# Patient Record
Sex: Female | Born: 1995 | Race: Asian | Hispanic: No | Marital: Single | Smoking: Never smoker
Health system: Southern US, Community
[De-identification: ages and names within clinical notes are randomized; demographics above are authoritative.]

## PROBLEM LIST (undated history)

## (undated) DIAGNOSIS — Z789 Other specified health status: Secondary | ICD-10-CM

## (undated) DIAGNOSIS — N83209 Unspecified ovarian cyst, unspecified side: Secondary | ICD-10-CM

## (undated) HISTORY — PX: WISDOM TOOTH EXTRACTION: SHX21

---
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
## 2016-01-28 ENCOUNTER — Emergency Department (HOSPITAL_COMMUNITY): Payer: BLUE CROSS/BLUE SHIELD

## 2016-01-28 ENCOUNTER — Emergency Department (HOSPITAL_COMMUNITY)
Admission: EM | Admit: 2016-01-28 | Discharge: 2016-01-28 | Disposition: A | Discharge status: Home / Self Care | Payer: BLUE CROSS/BLUE SHIELD | Attending: Emergency Medicine | Admitting: Emergency Medicine

## 2016-01-28 ENCOUNTER — Encounter (HOSPITAL_COMMUNITY): Payer: Self-pay | Admitting: Emergency Medicine

## 2016-01-28 DIAGNOSIS — R102 Pelvic and perineal pain: Secondary | ICD-10-CM | POA: Diagnosis not present

## 2016-01-28 DIAGNOSIS — N83201 Unspecified ovarian cyst, right side: Secondary | ICD-10-CM | POA: Diagnosis not present

## 2016-01-28 DIAGNOSIS — R1031 Right lower quadrant pain: Secondary | ICD-10-CM | POA: Diagnosis present

## 2016-01-28 LAB — LIPASE, BLOOD: Lipase: 32 U/L (ref 11–51)

## 2016-01-28 LAB — COMPREHENSIVE METABOLIC PANEL
ALK PHOS: 44 U/L (ref 38–126)
ALT: 20 U/L (ref 14–54)
AST: 20 U/L (ref 15–41)
Albumin: 4 g/dL (ref 3.5–5.0)
Anion gap: 10 (ref 5–15)
BUN: 11 mg/dL (ref 6–20)
CALCIUM: 9.4 mg/dL (ref 8.9–10.3)
CO2: 23 mmol/L (ref 22–32)
CREATININE: 0.75 mg/dL (ref 0.44–1.00)
Chloride: 104 mmol/L (ref 101–111)
Glucose, Bld: 139 mg/dL — ABNORMAL HIGH (ref 65–99)
Potassium: 4.3 mmol/L (ref 3.5–5.1)
SODIUM: 137 mmol/L (ref 135–145)
Total Bilirubin: 0.8 mg/dL (ref 0.3–1.2)
Total Protein: 7.2 g/dL (ref 6.5–8.1)

## 2016-01-28 LAB — URINALYSIS, ROUTINE W REFLEX MICROSCOPIC
BILIRUBIN URINE: NEGATIVE
Glucose, UA: NEGATIVE mg/dL
HGB URINE DIPSTICK: NEGATIVE
Leukocytes, UA: NEGATIVE
Nitrite: NEGATIVE
PROTEIN: 30 mg/dL — AB
Specific Gravity, Urine: 1.04 — ABNORMAL HIGH (ref 1.005–1.030)
pH: 7 (ref 5.0–8.0)

## 2016-01-28 LAB — CBC
HCT: 43.2 % (ref 36.0–46.0)
Hemoglobin: 14 g/dL (ref 12.0–15.0)
MCH: 27.9 pg (ref 26.0–34.0)
MCHC: 32.4 g/dL (ref 30.0–36.0)
MCV: 86.2 fL (ref 78.0–100.0)
PLATELETS: 234 10*3/uL (ref 150–400)
RBC: 5.01 MIL/uL (ref 3.87–5.11)
RDW: 12.7 % (ref 11.5–15.5)
WBC: 15.2 10*3/uL — ABNORMAL HIGH (ref 4.0–10.5)

## 2016-01-28 LAB — URINE MICROSCOPIC-ADD ON

## 2016-01-28 LAB — WET PREP, GENITAL
CLUE CELLS WET PREP: NONE SEEN
Sperm: NONE SEEN
TRICH WET PREP: NONE SEEN
Yeast Wet Prep HPF POC: NONE SEEN

## 2016-01-28 LAB — PREGNANCY, URINE: PREG TEST UR: NEGATIVE

## 2016-01-28 MED ORDER — ONDANSETRON HCL 4 MG/2ML IJ SOLN
4.0000 mg | Freq: Once | INTRAMUSCULAR | Status: AC
  Administered 2016-01-28: 4 mg via INTRAVENOUS
  Filled 2016-01-28: qty 2

## 2016-01-28 MED ORDER — MORPHINE SULFATE (PF) 4 MG/ML IV SOLN
4.0000 mg | Freq: Once | INTRAVENOUS | Status: AC
  Administered 2016-01-28: 4 mg via INTRAVENOUS
  Filled 2016-01-28: qty 1

## 2016-01-28 MED ORDER — METOCLOPRAMIDE HCL 10 MG PO TABS: 10.0000 mg | ORAL_TABLET | Freq: Four times a day (QID) | ORAL | 0 refills | Status: DC | PRN

## 2016-01-28 MED ORDER — IOPAMIDOL (ISOVUE-300) INJECTION 61%
INTRAVENOUS | Status: AC
  Administered 2016-01-28: 100 mL
  Filled 2016-01-28: qty 100

## 2016-01-28 MED ORDER — HYDROCODONE-ACETAMINOPHEN 5-325 MG PO TABS: 1.0000 | ORAL_TABLET | Freq: Four times a day (QID) | ORAL | 0 refills | Status: DC | PRN

## 2016-01-28 MED ORDER — IBUPROFEN 600 MG PO TABS: 600.0000 mg | ORAL_TABLET | Freq: Four times a day (QID) | ORAL | 0 refills | Status: DC | PRN

## 2016-01-28 MED ORDER — SODIUM CHLORIDE 0.9 % IV BOLUS (SEPSIS)
1000.0000 mL | Freq: Once | INTRAVENOUS | Status: AC
  Administered 2016-01-28: 1000 mL via INTRAVENOUS

## 2016-01-28 NOTE — ED Notes (Signed)
Pt reports that her hands and feet have been feeling cold since onset of symptoms last night at 11pm.

## 2016-01-28 NOTE — ED Provider Notes (Signed)
[name] DEPT Provider Note   CSN: [id number] Arrival date & time: 01/28/16  [id number]     History   Chief Complaint Chief Complaint  Patient presents with  . Abdominal Pain    Lower Right  . Nausea  . Emesis    HPI Morgan Moran is a 20 y.o. female here presenting with right lower quadrant pain. Right lower quadrant pain that is constant and all starting from 11 PM last night. Associated with 5 episodes of vomiting. She states that she has subjective chills as well. She states that she has no urinary symptoms and is sexually active but denies being pregnant. Last menstrual period about a month ago. She denies any vaginal bleeding or discharge. Took advil prior to arrival.   The history is provided by the patient.    History reviewed. No pertinent past medical history.  There are no active problems to display for this patient.   History reviewed. No pertinent surgical history.  OB History    No data available       Home Medications    Prior to Admission medications   Medication Sig Start Date End Date Taking? Authorizing Provider  ibuprofen (ADVIL,MOTRIN) 200 MG tablet Take 400 mg by mouth every 6 (six) hours as needed for moderate pain.   Yes Historical Provider, MD    Family History No family history on file.  Social History Social History  Substance Use Topics  . Smoking status: Never Smoker  . Smokeless tobacco: Never Used  . Alcohol use Yes     Allergies   Review of patient's allergies indicates no known allergies.   Review of Systems Review of Systems  Gastrointestinal: Positive for abdominal pain and vomiting.  All other systems reviewed and are negative.    Physical Exam Updated Vital Signs BP 122/86 (BP Location: Left Arm)   Pulse 75   Temp 98 F (36.7 C) (Oral)   Resp 16   Ht 5\' 4"  (1.626 m)   Wt 200 lb (90.7 kg)   LMP 12/26/2015 (Approximate)   SpO2 99%   BMI 34.33 kg/m   Physical Exam  Constitutional: She is oriented to  person, place, and time.  Slightly uncomfortable   HENT:  Head: Normocephalic.  Mouth/Throat: Oropharynx is clear and moist.  Eyes: EOM are normal. Pupils are equal, round, and reactive to light.  Neck: Normal range of motion. Neck supple.  Cardiovascular: Normal rate, regular rhythm and normal heart sounds.   Pulmonary/Chest: Effort normal and breath sounds normal. No respiratory distress. She has no wheezes. She has no rales.  Abdominal: Soft. Bowel sounds are normal.  + RLQ tenderness, no rebound.   Musculoskeletal: Normal range of motion.  Neurological: She is alert and oriented to person, place, and time.  Skin: Skin is warm.  Psychiatric: She has a normal mood and affect.  Nursing note and vitals reviewed.    ED Treatments / Results  Labs (all labs ordered are listed, but only abnormal results are displayed) Labs Reviewed  WET PREP, GENITAL - Abnormal; Notable for the following:       Result Value   WBC, Wet Prep HPF POC PRESENT (*)    All other components within normal limits  COMPREHENSIVE METABOLIC PANEL - Abnormal; Notable for the following:    Glucose, Bld 139 (*)    All other components within normal limits  CBC - Abnormal; Notable for the following:    WBC 15.2 (*)    All other components within normal  limits  URINALYSIS, ROUTINE W REFLEX MICROSCOPIC (NOT AT [facility]) - Abnormal; Notable for the following:    Color, Urine AMBER (*)    APPearance CLOUDY (*)    Specific Gravity, Urine 1.040 (*)    Ketones, ur >80 (*)    Protein, ur 30 (*)    All other components within normal limits  URINE MICROSCOPIC-ADD ON - Abnormal; Notable for the following:    Squamous Epithelial / LPF 6-30 (*)    Bacteria, UA FEW (*)    All other components within normal limits  LIPASE, BLOOD  PREGNANCY, URINE  GC/CHLAMYDIA PROBE AMP ([place]) NOT AT [facility]    EKG  EKG Interpretation None       Radiology US Transvaginal Non-ob  Result Date: 01/28/2016 CLINICAL DATA:  Right  pelvic pain. EXAM: TRANSABDOMINAL AND TRANSVAGINAL ULTRASOUND OF PELVIS DOPPLER ULTRASOUND OF OVARIES TECHNIQUE: Both transabdominal and transvaginal ultrasound examinations of the pelvis were performed. Transabdominal technique was performed for global imaging of the pelvis including uterus, ovaries, adnexal regions, and pelvic cul-de-sac. It was necessary to proceed with endovaginal exam following the transabdominal exam to visualize the uterus and ovaries. Color and duplex Doppler ultrasound was utilized to evaluate blood flow to the ovaries. COMPARISON:  CT 01/28/2016. FINDINGS: Uterus Measurements: 8.5 x 3.2 x 5.0 cm. No fibroids or other mass visualized. Nabothian cyst. Endometrium Thickness: 12 mm.  No focal abnormality visualized. Right ovary Measurements: 9.0 x 5.2 x 7.0 cm. 6.9 x 4.9 x 8.1 cm simple appearing cyst. Left ovary Measurements: 3.5 x 2.1 x 2.3 cm. Normal appearance/no adnexal mass. Pulsed Doppler evaluation of both ovaries demonstrates normal low-resistance arterial and venous waveforms. Other findings Small amount of free pelvic fluid. IMPRESSION: 6.9 x 4.9 x 8.1 cm simple right ovarian cyst. No evidence of torsion. Small amount of free pelvic fluid. Electronically Signed   By: Marcello Moores  Register   On: 01/28/2016 11:32   Ct Abdomen Pelvis W Contrast  Result Date: 01/28/2016 CLINICAL DATA:  Right lower quadrant pain since last night. Nausea and vomiting. Concern for appendicitis. EXAM: CT ABDOMEN AND PELVIS WITH CONTRAST TECHNIQUE: Multidetector CT imaging of the abdomen and pelvis was performed using the standard protocol following bolus administration of intravenous contrast. CONTRAST:  11mL ISOVUE-300 IOPAMIDOL (ISOVUE-300) INJECTION 61% COMPARISON:  None. FINDINGS: Lower chest: 3 x 2 mm calcified nodule in the right lower lobe. Hepatobiliary: No focal liver abnormality is seen. No gallstones, gallbladder wall thickening, or biliary dilatation. Pancreas: Unremarkable. Spleen:  Unremarkable. Adrenals/Urinary Tract: Adrenal glands are unremarkable. Kidneys are normal, without renal calculi, focal lesion, or hydronephrosis. Bladder is unremarkable. Stomach/Bowel: Stomach is within normal limits. Appendix appears normal. No evidence of bowel wall thickening or obstruction. Vascular/Lymphatic: No significant vascular findings are present. No enlarged abdominal or pelvic lymph nodes. Reproductive: Focal low-density at the level of the cervix may reflect fluid/ blood products in the endocervical canal. Left ovary is unremarkable. There is a 7.7 x 6.5 cm low-density cyst in the right adnexa. Small volume soft tissue along its margins may reflect normal ovarian tissue or a small amount of hemorrhage. There is mild surrounding stranding in the right adnexa/right lower quadrant. A right ovarian corpus luteum is suspected more posteriorly. Other: No abdominal wall mass or hernia. Musculoskeletal: No acute or significant osseous findings. IMPRESSION: 1. 7.7 cm right ovarian cyst with mild surrounding inflammatory change. If there is clinical concern for ovarian torsion, pelvic ultrasound (with Doppler imaging) is recommended for further evaluation. A follow-up ultrasound will also  be needed in 6-12 weeks to ensure resolution. 2. Normal appendix. Electronically Signed   By: Logan Bores M.D.   On: 01/28/2016 09:37   [place] Art/ven Flow Abd Pelv Doppler  Result Date: 01/28/2016 CLINICAL DATA:  Right pelvic pain. EXAM: TRANSABDOMINAL AND TRANSVAGINAL ULTRASOUND OF PELVIS DOPPLER ULTRASOUND OF OVARIES TECHNIQUE: Both transabdominal and transvaginal ultrasound examinations of the pelvis were performed. Transabdominal technique was performed for global imaging of the pelvis including uterus, ovaries, adnexal regions, and pelvic cul-de-sac. It was necessary to proceed with endovaginal exam following the transabdominal exam to visualize the uterus and ovaries. Color and duplex Doppler ultrasound was utilized  to evaluate blood flow to the ovaries. COMPARISON:  CT 01/28/2016. FINDINGS: Uterus Measurements: 8.5 x 3.2 x 5.0 cm. No fibroids or other mass visualized. Nabothian cyst. Endometrium Thickness: 12 mm.  No focal abnormality visualized. Right ovary Measurements: 9.0 x 5.2 x 7.0 cm. 6.9 x 4.9 x 8.1 cm simple appearing cyst. Left ovary Measurements: 3.5 x 2.1 x 2.3 cm. Normal appearance/no adnexal mass. Pulsed Doppler evaluation of both ovaries demonstrates normal low-resistance arterial and venous waveforms. Other findings Small amount of free pelvic fluid. IMPRESSION: 6.9 x 4.9 x 8.1 cm simple right ovarian cyst. No evidence of torsion. Small amount of free pelvic fluid. Electronically Signed   By: Marcello Moores  Register   On: 01/28/2016 11:32    Procedures Procedures (including critical care time)  Medications Ordered in ED Medications  sodium chloride 0.9 % bolus 1,000 mL (0 mLs Intravenous Stopped 01/28/16 0919)  ondansetron (ZOFRAN) injection 4 mg (4 mg Intravenous Given 01/28/16 0727)  iopamidol (ISOVUE-300) 61 % injection (100 mLs  Contrast Given 01/28/16 0834)  morphine 4 MG/ML injection 4 mg (4 mg Intravenous Given 01/28/16 0954)     Initial Impression / Assessment and Plan / ED Course  I have reviewed the triage vital signs and the nursing notes.  Pertinent labs & imaging results that were available during my care of the patient were reviewed by me and considered in my medical decision making (see chart for details).  Clinical Course    Morgan Moran is a 20 y.o. female here with RLQ pain and vomiting and chills. Likely appy. Will get CT ab/pel and labs and UA.   9:30 am CT showed nl appendix but there is 7 cm R ovarian cyst. Pelvic exam showed whitish discharge, not foul smelling. No CMT. I had hard time palpating the cyst on bimanual due to body habitus. Will get ovarian US to r/o torsion.   11:51 AM US showed 7 cm cyst with mild free fluid and no signs of torsion. Pain controlled.  Consulted Dr. Rip Harbour from [facility], who states that since patient's pain is under control, likely had ruptured cyst. WBC 15 but UA unremarkable. He felt that unlikely to have torsion/detorsion. Recommend pain meds, OB follow up and repeat US in several weeks. Updated patient.     Final Clinical Impressions(s) / ED Diagnoses   Final diagnoses:  Pelvic pain  Pelvic pain    New Prescriptions New Prescriptions   No medications on file     Drenda Freeze, MD 01/28/16 1152

## 2016-01-28 NOTE — Discharge Instructions (Signed)
Take motrin for pain.   Take vicodin for severe pain. Do NOT drive with it.   Take reglan for nausea.   Stay hydrated.   See GYN doctor at [facility]. You will need repeat ultrasound in several weeks  Return to ER if you have severe abdominal pain, vomiting, fevers.

## 2016-01-28 NOTE — ED Triage Notes (Signed)
Pt c/o Right lower quadrant abdominal pain with N/V onset at 11pm. Pt denies change in diet.

## 2016-01-31 LAB — GC/CHLAMYDIA PROBE AMP (~~LOC~~) NOT AT ARMC
CHLAMYDIA, DNA PROBE: NEGATIVE
NEISSERIA GONORRHEA: NEGATIVE

## 2017-01-31 ENCOUNTER — Ambulatory Visit (INDEPENDENT_AMBULATORY_CARE_PROVIDER_SITE_OTHER): Payer: Self-pay | Admitting: Nurse Practitioner

## 2017-01-31 VITALS — BP 105/70 | HR 70 | Temp 98.4°F | Resp 16 | Ht 64.5 in | Wt 188.8 lb

## 2017-01-31 DIAGNOSIS — Z0000 Encounter for general adult medical examination without abnormal findings: Secondary | ICD-10-CM

## 2017-01-31 NOTE — Patient Instructions (Addendum)
Preventive Care 18-39 Years, Female Preventive care refers to lifestyle choices and visits with your health care provider that can promote health and wellness. What does preventive care include?  A yearly physical exam. This is also called an annual well check.  Dental exams once or twice a year.  Routine eye exams. Ask your health care provider how often you should have your eyes checked.  Personal lifestyle choices, including: ? Daily care of your teeth and gums. ? Regular physical activity. ? Eating a healthy diet. ? Avoiding tobacco and drug use. ? Limiting alcohol use. ? Practicing safe sex. ? Taking vitamin and mineral supplements as recommended by your health care provider. What happens during an annual well check? The services and screenings done by your health care provider during your annual well check will depend on your age, overall health, lifestyle risk factors, and family history of disease. Counseling Your health care provider may ask you questions about your:  Alcohol use.  Tobacco use.  Drug use.  Emotional well-being.  Home and relationship well-being.  Sexual activity.  Eating habits.  Work and work Statistician.  Method of birth control.  Menstrual cycle.  Pregnancy history.  Screening You may have the following tests or measurements:  Height, weight, and BMI.  Diabetes screening. This is done by checking your blood sugar (glucose) after you have not eaten for a while (fasting).  Blood pressure.  Lipid and cholesterol levels. These may be checked every 5 years starting at age 66.  Skin check.  Hepatitis C blood test.  Hepatitis B blood test.  Sexually transmitted disease (STD) testing.  BRCA-related cancer screening. This may be done if you have a family history of breast, ovarian, tubal, or peritoneal cancers.  Pelvic exam and Pap test. This may be done every 3 years starting at age 40. Starting at age 59, this may be done every 5  years if you have a Pap test in combination with an HPV test.  Discuss your test results, treatment options, and if necessary, the need for more tests with your health care provider. Vaccines Your health care provider may recommend certain vaccines, such as:  Influenza vaccine. This is recommended every year.  Tetanus, diphtheria, and acellular pertussis (Tdap, Td) vaccine. You may need a Td booster every 10 years.  Varicella vaccine. You may need this if you have not been vaccinated.  HPV vaccine. If you are 69 or younger, you may need three doses over 6 months.  Measles, mumps, and rubella (MMR) vaccine. You may need at least one dose of MMR. You may also need a second dose.  Pneumococcal 13-valent conjugate (PCV13) vaccine. You may need this if you have certain conditions and were not previously vaccinated.  Pneumococcal polysaccharide (PPSV23) vaccine. You may need one or two doses if you smoke cigarettes or if you have certain conditions.  Meningococcal vaccine. One dose is recommended if you are age 27-21 years and a first-year college student living in a residence hall, or if you have one of several medical conditions. You may also need additional booster doses.  Hepatitis A vaccine. You may need this if you have certain conditions or if you travel or work in places where you may be exposed to hepatitis A.  Hepatitis B vaccine. You may need this if you have certain conditions or if you travel or work in places where you may be exposed to hepatitis B.  Haemophilus influenzae type b (Hib) vaccine. You may need this if  you have certain risk factors.  Talk to your health care provider about which screenings and vaccines you need and how often you need them. This information is not intended to replace advice given to you by your health care provider. Make sure you discuss any questions you have with your health care provider. Document Released: 06/06/2001 Document Revised: 12/29/2015  Document Reviewed: 02/09/2015 Elsevier Interactive Patient Education  2017 [place] Maintenance, Female Adopting a healthy lifestyle and getting preventive care can go a long way to promote health and wellness. Talk with your health care provider about what schedule of regular examinations is right for you. This is a good chance for you to check in with your provider about disease prevention and staying healthy. In between checkups, there are plenty of things you can do on your own. Experts have done a lot of research about which lifestyle changes and preventive measures are most likely to keep you healthy. Ask your health care provider for more information. Weight and diet Eat a healthy diet  Be sure to include plenty of vegetables, fruits, low-fat dairy products, and lean protein.  Do not eat a lot of foods high in solid fats, added sugars, or salt.  Get regular exercise. This is one of the most important things you can do for your health. ? Most adults should exercise for at least 150 minutes each week. The exercise should increase your heart rate and make you sweat (moderate-intensity exercise). ? Most adults should also do strengthening exercises at least twice a week. This is in addition to the moderate-intensity exercise.  Maintain a healthy weight  Body mass index (BMI) is a measurement that can be used to identify possible weight problems. It estimates body fat based on height and weight. Your health care provider can help determine your BMI and help you achieve or maintain a healthy weight.  For females 25 years of age and older: ? A BMI below 18.5 is considered underweight. ? A BMI of 18.5 to 24.9 is normal. ? A BMI of 25 to 29.9 is considered overweight. ? A BMI of 30 and above is considered obese.  Watch levels of cholesterol and blood lipids  You should start having your blood tested for lipids and cholesterol at 22 years of age, then have this test every 5  years.  You may need to have your cholesterol levels checked more often if: ? Your lipid or cholesterol levels are high. ? You are older than 22 years of age. ? You are at high risk for heart disease.  Cancer screening Lung Cancer  Lung cancer screening is recommended for adults 41-71 years old who are at high risk for lung cancer because of a history of smoking.  A yearly low-dose CT scan of the lungs is recommended for people who: ? Currently smoke. ? Have quit within the past 15 years. ? Have at least a 30-pack-year history of smoking. A pack year is smoking an average of one pack of cigarettes a day for 1 year.  Yearly screening should continue until it has been 15 years since you quit.  Yearly screening should stop if you develop a health problem that would prevent you from having lung cancer treatment.  Breast Cancer  Practice breast self-awareness. This means understanding how your breasts normally appear and feel.  It also means doing regular breast self-exams. Let your health care provider know about any changes, no matter how small.  If you are in your 12s  or 34s, you should have a clinical breast exam (CBE) by a health care provider every 1-3 years as part of a regular health exam.  If you are 80 or older, have a CBE every year. Also consider having a breast X-ray (mammogram) every year.  If you have a family history of breast cancer, talk to your health care provider about genetic screening.  If you are at high risk for breast cancer, talk to your health care provider about having an MRI and a mammogram every year.  Breast cancer gene (BRCA) assessment is recommended for women who have family members with BRCA-related cancers. BRCA-related cancers include: ? Breast. ? Ovarian. ? Tubal. ? Peritoneal cancers.  Results of the assessment will determine the need for genetic counseling and BRCA1 and BRCA2 testing.  Cervical Cancer Your health care provider may  recommend that you be screened regularly for cancer of the pelvic organs (ovaries, uterus, and vagina). This screening involves a pelvic examination, including checking for microscopic changes to the surface of your cervix (Pap test). You may be encouraged to have this screening done every 3 years, beginning at age 59.  For women ages 69-65, health care providers may recommend pelvic exams and Pap testing every 3 years, or they may recommend the Pap and pelvic exam, combined with testing for human papilloma virus (HPV), every 5 years. Some types of HPV increase your risk of cervical cancer. Testing for HPV may also be done on women of any age with unclear Pap test results.  Other health care providers may not recommend any screening for nonpregnant women who are considered low risk for pelvic cancer and who do not have symptoms. Ask your health care provider if a screening pelvic exam is right for you.  If you have had past treatment for cervical cancer or a condition that could lead to cancer, you need Pap tests and screening for cancer for at least 20 years after your treatment. If Pap tests have been discontinued, your risk factors (such as having a new sexual partner) need to be reassessed to determine if screening should resume. Some women have medical problems that increase the chance of getting cervical cancer. In these cases, your health care provider may recommend more frequent screening and Pap tests.  Colorectal Cancer  This type of cancer can be detected and often prevented.  Routine colorectal cancer screening usually begins at 23 years of age and continues through 22 years of age.  Your health care provider may recommend screening at an earlier age if you have risk factors for colon cancer.  Your health care provider may also recommend using home test kits to check for hidden blood in the stool.  A small camera at the end of a tube can be used to examine your colon directly  (sigmoidoscopy or colonoscopy). This is done to check for the earliest forms of colorectal cancer.  Routine screening usually begins at age 30.  Direct examination of the colon should be repeated every 5-10 years through 22 years of age. However, you may need to be screened more often if early forms of precancerous polyps or small growths are found.  Skin Cancer  Check your skin from head to toe regularly.  Tell your health care provider about any new moles or changes in moles, especially if there is a change in a mole's shape or color.  Also tell your health care provider if you have a mole that is larger than the size of a  pencil eraser.  Always use sunscreen. Apply sunscreen liberally and repeatedly throughout the day.  Protect yourself by wearing long sleeves, pants, a wide-brimmed hat, and sunglasses whenever you are outside.  Heart disease, diabetes, and high blood pressure  High blood pressure causes heart disease and increases the risk of stroke. High blood pressure is more likely to develop in: ? People who have blood pressure in the high end of the normal range (130-139/85-89 mm Hg). ? People who are overweight or obese. ? People who are African American.  If you are 18-92 years of age, have your blood pressure checked every 3-5 years. If you are 9 years of age or older, have your blood pressure checked every year. You should have your blood pressure measured twice-once when you are at a hospital or clinic, and once when you are not at a hospital or clinic. Record the average of the two measurements. To check your blood pressure when you are not at a hospital or clinic, you can use: ? An automated blood pressure machine at a pharmacy. ? A home blood pressure monitor.  If you are between 34 years and 63 years old, ask your health care provider if you should take aspirin to prevent strokes.  Have regular diabetes screenings. This involves taking a blood sample to check your  fasting blood sugar level. ? If you are at a normal weight and have a low risk for diabetes, have this test once every three years after 22 years of age. ? If you are overweight and have a high risk for diabetes, consider being tested at a younger age or more often. Preventing infection Hepatitis B  If you have a higher risk for hepatitis B, you should be screened for this virus. You are considered at high risk for hepatitis B if: ? You were born in a country where hepatitis B is common. Ask your health care provider which countries are considered high risk. ? Your parents were born in a high-risk country, and you have not been immunized against hepatitis B (hepatitis B vaccine). ? You have HIV or AIDS. ? You use needles to inject street drugs. ? You live with someone who has hepatitis B. ? You have had sex with someone who has hepatitis B. ? You get hemodialysis treatment. ? You take certain medicines for conditions, including cancer, organ transplantation, and autoimmune conditions.  Hepatitis C  Blood testing is recommended for: ? Everyone born from [birth year] through 1966. ? Anyone with known risk factors for hepatitis C.  Sexually transmitted infections (STIs)  You should be screened for sexually transmitted infections (STIs) including gonorrhea and chlamydia if: ? You are sexually active and are younger than 22 years of age. ? You are older than 22 years of age and your health care provider tells you that you are at risk for this type of infection. ? Your sexual activity has changed since you were last screened and you are at an increased risk for chlamydia or gonorrhea. Ask your health care provider if you are at risk.  If you do not have HIV, but are at risk, it may be recommended that you take a prescription medicine daily to prevent HIV infection. This is called pre-exposure prophylaxis (PrEP). You are considered at risk if: ? You are sexually active and do not regularly use condoms  or know the HIV status of your partner(s). ? You take drugs by injection. ? You are sexually active with a partner who has HIV.  Talk with your health care provider about whether you are at high risk of being infected with HIV. If you choose to begin PrEP, you should first be tested for HIV. You should then be tested every 3 months for as long as you are taking PrEP. Pregnancy  If you are premenopausal and you may become pregnant, ask your health care provider about preconception counseling.  If you may become pregnant, take 400 to 800 micrograms (mcg) of folic acid every day.  If you want to prevent pregnancy, talk to your health care provider about birth control (contraception). Osteoporosis and menopause  Osteoporosis is a disease in which the bones lose minerals and strength with aging. This can result in serious bone fractures. Your risk for osteoporosis can be identified using a bone density scan.  If you are 60 years of age or older, or if you are at risk for osteoporosis and fractures, ask your health care provider if you should be screened.  Ask your health care provider whether you should take a calcium or vitamin D supplement to lower your risk for osteoporosis.  Menopause may have certain physical symptoms and risks.  Hormone replacement therapy may reduce some of these symptoms and risks. Talk to your health care provider about whether hormone replacement therapy is right for you. Follow these instructions at home:  Schedule regular health, dental, and eye exams.  Stay current with your immunizations.  Do not use any tobacco products including cigarettes, chewing tobacco, or electronic cigarettes.  If you are pregnant, do not drink alcohol.  If you are breastfeeding, limit how much and how often you drink alcohol.  Limit alcohol intake to no more than 1 drink per day for nonpregnant women. One drink equals 12 ounces of beer, 5 ounces of wine, or 1 ounces of hard  liquor.  Do not use street drugs.  Do not share needles.  Ask your health care provider for help if you need support or information about quitting drugs.  Tell your health care provider if you often feel depressed.  Tell your health care provider if you have ever been abused or do not feel safe at home. This information is not intended to replace advice given to you by your health care provider. Make sure you discuss any questions you have with your health care provider. Document Released: 10/24/2010 Document Revised: 09/16/2015 Document Reviewed: 01/12/2015 Elsevier Interactive Patient Education  [employer].

## 2017-01-31 NOTE — Progress Notes (Signed)
Subjective:  Morgan Moran is a 21 y.o. female who presents for basic physical exam. Patient denies any current health related concerns. Patient denies any past medical history.  She has no allergies to food or medications and does not take any medications. Patient needs varicella, tdap, tb skin test vaccines/immunizations and will follow up at the local health department.     Social History  Substance Use Topics  . Smoking status: Never Smoker  . Smokeless tobacco: Never Used  . Alcohol use Yes    No Known Allergies  Current Outpatient Prescriptions  Medication Sig Dispense Refill  . HYDROcodone-acetaminophen (NORCO/VICODIN) 5-325 MG tablet Take 1 tablet by mouth every 6 (six) hours as needed. (Patient not taking: Reported on 01/31/2017) 10 tablet 0  . ibuprofen (ADVIL,MOTRIN) 200 MG tablet Take 400 mg by mouth every 6 (six) hours as needed for moderate pain.    Marland Kitchen ibuprofen (ADVIL,MOTRIN) 600 MG tablet Take 1 tablet (600 mg total) by mouth every 6 (six) hours as needed. (Patient not taking: Reported on 01/31/2017) 30 tablet 0  . metoCLOPramide (REGLAN) 10 MG tablet Take 1 tablet (10 mg total) by mouth every 6 (six) hours as needed for nausea (nausea/headache). (Patient not taking: Reported on 01/31/2017) 8 tablet 0   No current facility-administered medications for this visit.     Review of Systems  Constitutional: Negative.   HENT: Negative.   Eyes: Negative.   Respiratory: Negative.   Cardiovascular: Negative.   Musculoskeletal: Negative.   Skin: Negative.   Neurological: Negative.   Psychiatric/Behavioral: Negative.     BP 105/70 (BP Location: Right Arm, Patient Position: Sitting, Cuff Size: Normal)   Pulse 70   Temp 98.4 F (36.9 C) (Oral)   Resp 16   Ht 5' 4.5" (1.638 m)   Wt 188 lb 12.8 oz (85.6 kg)   SpO2 99%   BMI 31.91 kg/m    Objective:  BP 105/70 (BP Location: Right Arm, Patient Position: Sitting, Cuff Size: Normal)   Pulse 70   Temp 98.4 F (36.9 C)  (Oral)   Resp 16   Ht 5' 4.5" (1.638 m)   Wt 188 lb 12.8 oz (85.6 kg)   SpO2 99%   BMI 31.91 kg/m   General Appearance:  Alert, cooperative, no distress, appears stated age  Head:  Normocephalic, without obvious abnormality, atraumatic  Eyes:  PERRL, conjunctiva/corneas clear, EOM's intact, fundi benign, both eyes  Ears:  Normal TM's and external ear canals, both ears  Nose: Nares normal, septum midline,mucosa normal, no drainage or sinus tenderness  Throat: Lips, mucosa, and tongue normal; teeth and gums normal  Neck: Supple, symmetrical, trachea midline, no adenopathy;  thyroid: not enlarged, symmetric, no tenderness/mass/nodules; no carotid bruit or JVD  Back:   Symmetric, no curvature, ROM normal, no CVA tenderness  Lungs:   Clear to auscultation bilaterally, respirations unlabored     Heart:  Regular rate and rhythm, S1 and S2 normal, no murmur, rub, or gallop  Abdomen:   Soft, non-tender, bowel sounds active all four quadrants,  no masses, no organomegaly  Pelvic: Deferred  Extremities: Extremities normal, atraumatic, no cyanosis or edema  Pulses: 2+ and symmetric  Skin: Skin color, texture, turgor normal, no rashes or lesions     Neurologic: Normal        Assessment:  basic physical exam    Plan:  Patient education provided.  No labs needed at this time. Patient will follow up with PCP.

## 2017-02-12 ENCOUNTER — Other Ambulatory Visit: Payer: Self-pay | Admitting: Obstetrics and Gynecology

## 2017-02-19 NOTE — Patient Instructions (Addendum)
Your procedure is scheduled on:  Wednesday, Nov 7  Enter through the [employer] of [facility] at: 7:30 am  Pick up the phone at the desk and dial [phone].  Call this number if you have problems the morning of surgery: [phone].  Remember: Do NOT eat food or drink clear liquids (including water) after midnight Tuesday  Take these medicines the morning of surgery with a SIP OF WATER:  None  Do NOT wear jewelry (body piercing), metal hair clips/bobby pins, make-up, or nail polish. Do NOT wear lotions, powders, or perfumes.  You may wear deoderant. Do NOT shave for 48 hours prior to surgery. Do NOT bring valuables to the hospital. . Have a responsible adult drive you home and stay with you for 24 hours after your procedure.  Home with Mother Sry Flewellen cell [phone].

## 2017-02-20 ENCOUNTER — Encounter (HOSPITAL_COMMUNITY): Payer: Self-pay | Admitting: *Deleted

## 2017-02-20 ENCOUNTER — Encounter (HOSPITAL_COMMUNITY)
Admission: RE | Admit: 2017-02-20 | Discharge: 2017-02-20 | Disposition: A | Discharge status: Home / Self Care | Payer: BLUE CROSS/BLUE SHIELD | Source: Ambulatory Visit | Attending: Obstetrics and Gynecology | Admitting: Obstetrics and Gynecology

## 2017-02-20 DIAGNOSIS — Z01812 Encounter for preprocedural laboratory examination: Secondary | ICD-10-CM | POA: Insufficient documentation

## 2017-02-20 DIAGNOSIS — N83209 Unspecified ovarian cyst, unspecified side: Secondary | ICD-10-CM | POA: Insufficient documentation

## 2017-02-20 HISTORY — DX: Unspecified ovarian cyst, unspecified side: N83.209

## 2017-02-20 LAB — CBC
HCT: 40.5 % (ref 36.0–46.0)
Hemoglobin: 13.9 g/dL (ref 12.0–15.0)
MCH: 29 pg (ref 26.0–34.0)
MCHC: 34.3 g/dL (ref 30.0–36.0)
MCV: 84.6 fL (ref 78.0–100.0)
PLATELETS: 249 10*3/uL (ref 150–400)
RBC: 4.79 MIL/uL (ref 3.87–5.11)
RDW: 12.8 % (ref 11.5–15.5)
WBC: 8.2 10*3/uL (ref 4.0–10.5)

## 2017-02-21 NOTE — H&P (Signed)
Morgan Moran is a 21 y.o.  female, G:0   presents for ovarian cystectomy.  In October 2017 the patient went to [facility] ER for severe pelvic pain that was diagnosed as a ruptured right ovarian cyst.  At that time another right ovarian cyst was identified  on CT scan, measuring 7.7 cm  (8.1 cm on ultrasound) and the patient was advised to follow up with a gynecologist for management.  The patient denies any pelvic pain since her cyst rupture, no changes in bowel or bladder function, dyspareunia, intermenstrual bleeding or lower back pain. Her menstrual flow is monthly for 7 days with pad change every 3 hours with no cramping.   In September 2018 she began Depo Provera.  A repeat pelvic ultrasound at that time showed:  anteverted uterus: 4.26 x 4.82 x 3.40 cm, endometrium: 0.64 cm; left ovary: 3.42 cm and right ovary: 3.92 cm with a simple cyst measuring 7.0 x 4.7 x 7.9 cm extending superior-anterior to uterus.  Given the persistent nature of this cyst along with risk of torsion the patient has decided to proceed with removal of the ovarian cyst.   Past Medical History  OB History: G:0  GYN History: menarche:  21 YO    LMP: 12/27/2016    Contracepton Depo-Provera  The patient denies history of sexually transmitted disease.  Denies history of abnormal PAP smear.    Last PAP smear: 11/2016-normal  Medical History: negative  Surgical History: negative Denies problems with anesthesia or history of blood transfusions  Family History: Diabetes Mellitus  Social History:  Single and employed as a Scientist, forensic;   Denies tobacco intake and occasional alcohol intake   Medications: Depo Provera 150 mg IM  No Known Allergies  ROS: Admits to acne but denies headache, vision changes, nasal congestion, dysphagia, tinnitus, dizziness, hoarseness, cough,  chest pain, shortness of breath, nausea, vomiting, diarrhea,constipation,  urinary frequency, urgency  dysuria, hematuria, vaginitis symptoms, pelvic pain,  swelling of joints,easy bruising,  myalgias, arthralgias,  unexplained weight loss and except as is mentioned in the history of present illness, patient's review of systems is otherwise negative.   Physical Exam  Bp:   96/60   P: 60 bpm    R: 16   Temperature: 98.6 degrees F orally   Weight: 196 lbs.  Height: 5\' 4"   BMI: 33.6   Neck: supple without masses or thyromegaly Lungs: clear to auscultation Heart: regular rate and rhythm Abdomen: soft, non-tender and no organomegaly Pelvic:EGBUS- wnl; vagina-normal rugae; uterus-normal size, cervix without lesions or motion tenderness; adnexae-no tenderness but right adnexal fullness Extremities:  no clubbing, cyanosis or edema   Assesment:  Large Persistent Ovarian Cyst  Disposition:  A discussion was held with patient regarding the indication for her procedure(s) along with the risks, which include but are not limited to: reaction to anesthesia, damage to adjacent organs, infection,  excessive bleeding and possible need for an open abdominal incision. The patient verbalized understanding of these risks and has consented to proceed with a Laparoscopic Right Ovarian Cystectomy with Possible Laparotomy at [facility] on February 28, 2017 at 9 a. m.   CSN# [id number]   Rozlyn Yerby J. Florene Glen, PA-C  for Dr. Franklyn Lor. Dillard

## 2017-02-28 ENCOUNTER — Encounter (HOSPITAL_COMMUNITY)
Admission: RE | Disposition: A | Discharge status: Home / Self Care | Payer: Self-pay | Source: Ambulatory Visit | Attending: Obstetrics and Gynecology

## 2017-02-28 ENCOUNTER — Ambulatory Visit (HOSPITAL_COMMUNITY)
Admission: RE | Admit: 2017-02-28 | Discharge: 2017-02-28 | Disposition: A | Discharge status: Home / Self Care | Payer: BLUE CROSS/BLUE SHIELD | Source: Ambulatory Visit | Attending: Obstetrics and Gynecology | Admitting: Obstetrics and Gynecology

## 2017-02-28 ENCOUNTER — Ambulatory Visit (HOSPITAL_COMMUNITY): Payer: BLUE CROSS/BLUE SHIELD | Admitting: Anesthesiology

## 2017-02-28 ENCOUNTER — Other Ambulatory Visit: Payer: Self-pay

## 2017-02-28 ENCOUNTER — Encounter (HOSPITAL_COMMUNITY): Payer: Self-pay | Admitting: *Deleted

## 2017-02-28 DIAGNOSIS — D270 Benign neoplasm of right ovary: Secondary | ICD-10-CM | POA: Diagnosis not present

## 2017-02-28 DIAGNOSIS — Z793 Long term (current) use of hormonal contraceptives: Secondary | ICD-10-CM | POA: Insufficient documentation

## 2017-02-28 DIAGNOSIS — N838 Other noninflammatory disorders of ovary, fallopian tube and broad ligament: Secondary | ICD-10-CM

## 2017-02-28 DIAGNOSIS — Z833 Family history of diabetes mellitus: Secondary | ICD-10-CM | POA: Insufficient documentation

## 2017-02-28 DIAGNOSIS — N8353 Torsion of ovary, ovarian pedicle and fallopian tube: Secondary | ICD-10-CM | POA: Diagnosis not present

## 2017-02-28 DIAGNOSIS — N83201 Unspecified ovarian cyst, right side: Secondary | ICD-10-CM | POA: Diagnosis present

## 2017-02-28 HISTORY — DX: Other specified health status: Z78.9

## 2017-02-28 HISTORY — PX: LAPAROSCOPY: SHX197

## 2017-02-28 LAB — HEMOGLOBIN AND HEMATOCRIT, BLOOD
HCT: 38.1 % (ref 36.0–46.0)
Hemoglobin: 12.9 g/dL (ref 12.0–15.0)

## 2017-02-28 LAB — PREGNANCY, URINE: Preg Test, Ur: NEGATIVE

## 2017-02-28 SURGERY — LAPAROSCOPY, DIAGNOSTIC
Anesthesia: General | Site: Abdomen | Laterality: Right

## 2017-02-28 MED ORDER — BUPIVACAINE-EPINEPHRINE 0.25% -1:200000 IJ SOLN
INTRAMUSCULAR | Status: DC | PRN
  Administered 2017-02-28: 17 mL

## 2017-02-28 MED ORDER — SILVER NITRATE-POT NITRATE 75-25 % EX MISC
CUTANEOUS | Status: AC
Start: 2017-02-28 — End: ?
  Filled 2017-02-28: qty 1

## 2017-02-28 MED ORDER — KETOROLAC TROMETHAMINE 30 MG/ML IJ SOLN
INTRAMUSCULAR | Status: AC
  Filled 2017-02-28: qty 1

## 2017-02-28 MED ORDER — FENTANYL CITRATE (PF) 250 MCG/5ML IJ SOLN
INTRAMUSCULAR | Status: AC
  Filled 2017-02-28: qty 5

## 2017-02-28 MED ORDER — FENTANYL CITRATE (PF) 100 MCG/2ML IJ SOLN: 25.0000 ug | INTRAMUSCULAR | Status: DC | PRN

## 2017-02-28 MED ORDER — IBUPROFEN 600 MG PO TABS: ORAL_TABLET | ORAL | 1 refills | Status: AC

## 2017-02-28 MED ORDER — DEXAMETHASONE SODIUM PHOSPHATE 4 MG/ML IJ SOLN
INTRAMUSCULAR | Status: AC
  Filled 2017-02-28: qty 1

## 2017-02-28 MED ORDER — MIDAZOLAM HCL 2 MG/2ML IJ SOLN
INTRAMUSCULAR | Status: AC
  Filled 2017-02-28: qty 2

## 2017-02-28 MED ORDER — SUGAMMADEX SODIUM 200 MG/2ML IV SOLN
INTRAVENOUS | Status: DC | PRN
  Administered 2017-02-28: 200 mg via INTRAVENOUS

## 2017-02-28 MED ORDER — SCOPOLAMINE 1 MG/3DAYS TD PT72
MEDICATED_PATCH | TRANSDERMAL | Status: AC
  Filled 2017-02-28: qty 1

## 2017-02-28 MED ORDER — ACETAMINOPHEN 500 MG PO TABS
1000.0000 mg | ORAL_TABLET | Freq: Once | ORAL | Status: AC
  Administered 2017-02-28: 1000 mg via ORAL

## 2017-02-28 MED ORDER — ONDANSETRON HCL 4 MG/2ML IJ SOLN
INTRAMUSCULAR | Status: AC
Start: 2017-02-28 — End: ?
  Filled 2017-02-28: qty 2

## 2017-02-28 MED ORDER — SUGAMMADEX SODIUM 200 MG/2ML IV SOLN
INTRAVENOUS | Status: AC
  Filled 2017-02-28: qty 2

## 2017-02-28 MED ORDER — BUPIVACAINE HCL (PF) 0.25 % IJ SOLN
INTRAMUSCULAR | Status: AC
  Filled 2017-02-28: qty 30

## 2017-02-28 MED ORDER — ROCURONIUM BROMIDE 100 MG/10ML IV SOLN
INTRAVENOUS | Status: AC
  Filled 2017-02-28: qty 1

## 2017-02-28 MED ORDER — ONDANSETRON HCL 4 MG/2ML IJ SOLN
INTRAMUSCULAR | Status: DC | PRN
  Administered 2017-02-28: 4 mg via INTRAVENOUS

## 2017-02-28 MED ORDER — HYDROCODONE-ACETAMINOPHEN 5-325 MG PO TABS: ORAL_TABLET | ORAL | 0 refills | Status: DC

## 2017-02-28 MED ORDER — PROPOFOL 10 MG/ML IV BOLUS
INTRAVENOUS | Status: AC
  Filled 2017-02-28: qty 20

## 2017-02-28 MED ORDER — GABAPENTIN 300 MG PO CAPS
300.0000 mg | ORAL_CAPSULE | Freq: Once | ORAL | Status: AC
  Administered 2017-02-28: 300 mg via ORAL

## 2017-02-28 MED ORDER — SODIUM CHLORIDE 0.9 % IR SOLN
Status: DC | PRN
  Administered 2017-02-28: 3000 mL

## 2017-02-28 MED ORDER — PROMETHAZINE HCL 25 MG/ML IJ SOLN: 6.2500 mg | INTRAMUSCULAR | Status: DC | PRN

## 2017-02-28 MED ORDER — FENTANYL CITRATE (PF) 100 MCG/2ML IJ SOLN
INTRAMUSCULAR | Status: DC | PRN
  Administered 2017-02-28: 100 ug via INTRAVENOUS
  Administered 2017-02-28: 50 ug via INTRAVENOUS
  Administered 2017-02-28: 100 ug via INTRAVENOUS

## 2017-02-28 MED ORDER — LIDOCAINE HCL (CARDIAC) 20 MG/ML IV SOLN
INTRAVENOUS | Status: AC
  Filled 2017-02-28: qty 5

## 2017-02-28 MED ORDER — LIDOCAINE HCL (CARDIAC) 20 MG/ML IV SOLN
INTRAVENOUS | Status: DC | PRN
  Administered 2017-02-28: 100 mg via INTRAVENOUS

## 2017-02-28 MED ORDER — FERRIC SUBSULFATE 259 MG/GM EX SOLN
CUTANEOUS | Status: AC
  Filled 2017-02-28: qty 8

## 2017-02-28 MED ORDER — KETOROLAC TROMETHAMINE 30 MG/ML IJ SOLN
INTRAMUSCULAR | Status: DC | PRN
  Administered 2017-02-28: 30 mg via INTRAVENOUS

## 2017-02-28 MED ORDER — GLYCOPYRROLATE 0.2 MG/ML IJ SOLN
INTRAMUSCULAR | Status: DC | PRN
  Administered 2017-02-28: 0.2 mg via INTRAVENOUS

## 2017-02-28 MED ORDER — PROPOFOL 10 MG/ML IV BOLUS
INTRAVENOUS | Status: DC | PRN
Start: 2017-02-28 — End: 2017-02-28
  Administered 2017-02-28: 180 mg via INTRAVENOUS

## 2017-02-28 MED ORDER — DEXAMETHASONE SODIUM PHOSPHATE 10 MG/ML IJ SOLN
INTRAMUSCULAR | Status: DC | PRN
  Administered 2017-02-28: 4 mg via INTRAVENOUS

## 2017-02-28 MED ORDER — MIDAZOLAM HCL 2 MG/2ML IJ SOLN
INTRAMUSCULAR | Status: DC | PRN
  Administered 2017-02-28: 2 mg via INTRAVENOUS

## 2017-02-28 MED ORDER — ACETAMINOPHEN 500 MG PO TABS
ORAL_TABLET | ORAL | Status: AC
  Filled 2017-02-28: qty 2

## 2017-02-28 MED ORDER — LACTATED RINGERS IV SOLN
INTRAVENOUS | Status: DC
  Administered 2017-02-28 (×2): via INTRAVENOUS

## 2017-02-28 MED ORDER — GABAPENTIN 300 MG PO CAPS
ORAL_CAPSULE | ORAL | Status: DC
Start: 2017-02-28 — End: 2017-02-28
  Filled 2017-02-28: qty 1

## 2017-02-28 MED ORDER — SCOPOLAMINE 1 MG/3DAYS TD PT72
1.0000 | MEDICATED_PATCH | Freq: Once | TRANSDERMAL | Status: DC
  Administered 2017-02-28: 1.5 mg via TRANSDERMAL

## 2017-02-28 MED ORDER — ROCURONIUM BROMIDE 100 MG/10ML IV SOLN
INTRAVENOUS | Status: DC | PRN
  Administered 2017-02-28: 40 mg via INTRAVENOUS
  Administered 2017-02-28: 20 mg via INTRAVENOUS
  Administered 2017-02-28: 10 mg via INTRAVENOUS

## 2017-02-28 SURGICAL SUPPLY — 61 items
ADH SKN CLS APL DERMABOND .7 (GAUZE/BANDAGES/DRESSINGS) ×3
BAG SPEC RTRVL LRG 6X4 10 (ENDOMECHANICALS)
BARRIER ADHS 3X4 INTERCEED (GAUZE/BANDAGES/DRESSINGS) IMPLANT
BLADE SURG 10 STRL SS (BLADE) ×6 IMPLANT
BRR ADH 4X3 ABS CNTRL BYND (GAUZE/BANDAGES/DRESSINGS)
CABLE HIGH FREQUENCY MONO STRZ (ELECTRODE) IMPLANT
CANISTER SUCT 3000ML PPV (MISCELLANEOUS) ×5 IMPLANT
CLOTH BEACON ORANGE TIMEOUT ST (SAFETY) ×5 IMPLANT
CONTAINER PREFILL 10% NBF 15ML (MISCELLANEOUS) IMPLANT
DECANTER SPIKE VIAL GLASS SM (MISCELLANEOUS) ×4 IMPLANT
DERMABOND ADVANCED (GAUZE/BANDAGES/DRESSINGS) ×2
DERMABOND ADVANCED .7 DNX12 (GAUZE/BANDAGES/DRESSINGS) ×3 IMPLANT
DRAPE WARM FLUID 44X44 (DRAPE) IMPLANT
DRSG OPSITE POSTOP 3X4 (GAUZE/BANDAGES/DRESSINGS) ×4 IMPLANT
DRSG VASELINE 3X18 (GAUZE/BANDAGES/DRESSINGS) IMPLANT
DURAPREP 26ML APPLICATOR (WOUND CARE) ×5 IMPLANT
ELECT CAUTERY BLADE 6.4 (BLADE) IMPLANT
FORCEPS CUTTING 33CM 5MM (CUTTING FORCEPS) IMPLANT
FORCEPS CUTTING 45CM 5MM (CUTTING FORCEPS) IMPLANT
GAUZE SPONGE 4X4 16PLY XRAY LF (GAUZE/BANDAGES/DRESSINGS) IMPLANT
GLOVE BIO SURGEON STRL SZ 6.5 (GLOVE) ×4 IMPLANT
GLOVE BIO SURGEONS STRL SZ 6.5 (GLOVE) ×1
GLOVE BIOGEL PI IND STRL 7.0 (GLOVE) ×9 IMPLANT
GLOVE BIOGEL PI INDICATOR 7.0 (GLOVE) ×6
GOWN STRL REUS W/TWL LRG LVL3 (GOWN DISPOSABLE) ×10 IMPLANT
MANIPULATOR UTERINE 4.5 ZUMI (MISCELLANEOUS) IMPLANT
NEEDLE HYPO 22GX1.5 SAFETY (NEEDLE) IMPLANT
NS IRRIG 1000ML POUR BTL (IV SOLUTION) ×5 IMPLANT
PACK ABDOMINAL GYN (CUSTOM PROCEDURE TRAY) ×5 IMPLANT
PACK LAPAROSCOPY BASIN (CUSTOM PROCEDURE TRAY) ×5 IMPLANT
PACK TRENDGUARD 450 HYBRID PRO (MISCELLANEOUS) ×2 IMPLANT
PACK TRENDGUARD 600 HYBRD PROC (MISCELLANEOUS) IMPLANT
PAD OB MATERNITY 4.3X12.25 (PERSONAL CARE ITEMS) ×5 IMPLANT
PENCIL SMOKE EVAC W/HOLSTER (ELECTROSURGICAL) ×5 IMPLANT
POUCH SPECIMEN RETRIEVAL 10MM (ENDOMECHANICALS) IMPLANT
PROTECTOR NERVE ULNAR (MISCELLANEOUS) ×10 IMPLANT
SET IRRIG TUBING LAPAROSCOPIC (IRRIGATION / IRRIGATOR) ×4 IMPLANT
SHEARS HARMONIC ACE PLUS 36CM (ENDOMECHANICALS) IMPLANT
SLEEVE XCEL OPT CAN 5 100 (ENDOMECHANICALS) ×4 IMPLANT
SOLUTION ELECTROLUBE (MISCELLANEOUS) ×4 IMPLANT
SPONGE LAP 18X18 X RAY DECT (DISPOSABLE) ×2 IMPLANT
SPONGE SURGIFOAM ABS GEL 12-7 (HEMOSTASIS) ×4 IMPLANT
STAPLER VISISTAT 35W (STAPLE) ×5 IMPLANT
SUT CHROMIC 2 0 CT 1 (SUTURE) ×1 IMPLANT
SUT MNCRL AB 3-0 PS2 27 (SUTURE) ×5 IMPLANT
SUT PDS AB 0 CT 36 (SUTURE) IMPLANT
SUT PLAIN 2 0 XLH (SUTURE) IMPLANT
SUT VIC AB 0 CT1 27 (SUTURE)
SUT VIC AB 0 CT1 27XBRD ANBCTR (SUTURE) ×2 IMPLANT
SUT VICRYL 0 ENDOLOOP (SUTURE) IMPLANT
SUT VICRYL 0 TIES 12 18 (SUTURE) ×1 IMPLANT
SUT VICRYL 0 UR6 27IN ABS (SUTURE) ×10 IMPLANT
SYR 50ML LL SCALE MARK (SYRINGE) ×4 IMPLANT
SYR CONTROL 10ML LL (SYRINGE) IMPLANT
TOWEL OR 17X24 6PK STRL BLUE (TOWEL DISPOSABLE) ×10 IMPLANT
TRAY FOLEY CATH SILVER 14FR (SET/KITS/TRAYS/PACK) ×5 IMPLANT
TRENDGUARD 450 HYBRID PRO PACK (MISCELLANEOUS) ×5
TRENDGUARD 600 HYBRID PROC PK (MISCELLANEOUS)
TROCAR BALLN 12MMX100 BLUNT (TROCAR) ×5 IMPLANT
TROCAR XCEL NON-BLD 5MMX100MML (ENDOMECHANICALS) ×5 IMPLANT
WARMER LAPAROSCOPE (MISCELLANEOUS) ×5 IMPLANT

## 2017-02-28 NOTE — Anesthesia Postprocedure Evaluation (Signed)
Anesthesia Post Note  Patient: Morgan Moran  Procedure(s) Performed: LAPAROSCOPIC OVARIAN CYSTECTOMY (Right Abdomen) LAPAROTOMY (N/A Abdomen)     Patient location during evaluation: PACU Anesthesia Type: General Level of consciousness: sedated Pain management: pain level controlled Vital Signs Assessment: post-procedure vital signs reviewed and stable Respiratory status: spontaneous breathing and respiratory function stable Cardiovascular status: stable Postop Assessment: no apparent nausea or vomiting Anesthetic complications: no    Last Vitals:  Vitals:   02/28/17 1115 02/28/17 1130  BP: 127/77 122/79  Pulse: 77 63  Resp: 14 (!) 21  Temp:    SpO2: 99% 100%    Last Pain:  Vitals:   02/28/17 1115  TempSrc:   PainSc: 1    Pain Goal: Patients Stated Pain Goal: 3 (02/28/17 0732)               Duane Boston DANIEL

## 2017-02-28 NOTE — Op Note (Signed)
Diagnostic Laparoscopy Procedure Note  Indications: The patient is a 21 y.o. female with large ovarian cyst.  Pre-operative Diagnosis: ovarian cyst   Post-operative Diagnosis: Right fallopian tube cyst  Surgeon: ZMOQHUT,MLYYT A   Assistants: Floydene Flock  Anesthesia: General endotracheal anesthesia  ASA Class: none  Procedure Details  The patient was seen in the Holding Room. The risks, benefits, complications, treatment options, and expected outcomes were discussed with the patient. The possibilities of reaction to medication, pulmonary aspiration, perforation of viscus, bleeding, recurrent infection, the need for additional procedures, failure to diagnose a condition, and creating a complication requiring transfusion or operation were discussed with the patient. The patient concurred with the proposed plan, giving informed consent. The patient was taken to the Operating Room, identified as Morgan Moran and the procedure verified as Operative Laparoscopy. A Time Out was held and the above information confirmed.  After induction of general anesthesia, the patient was placed in modified dorsal lithotomy position where she was prepped, draped, and catheterized in the normal, sterile fashion.  The cervix was visualized and an intrauterine manipulator was placed. A 2 cm umbilical incision was then performed.  The incision was taken down to the fascia.  Fascia was then incised and extended bilaterally.  The peritoneum was opened up with a hemostat.  Her Vicryl was placed around the fascia in a circumferential fashion.  The Sheryle Hail was placed into the abdominal cavity and anchored to the suture.  Insufflation with CO2 gas was done.  The patient's right adnexa was torsed.  Once we can get a better look it turned out that the cyst was in her right fallopian tube which was torsed around the right ovary.  The uterus and ovaries were normal.  The appendix appeared normal.  Gallbladder appeared normal.  2  small incisions were placed in the right and left lower quadrants.  2 5 mm trochars were placed in direct visualization.  The irrigator was placed into the lower trocar and the fallopian tube cyst was aspirated clear straw fluid was seen but some fluid was sent for cytology.  The mesosalpinx of the fallopian tube was then opened up and the cyst wall was obtained and removed without difficulty.  Was done by blunt dissection.  Vision was done of the fallopian tube mesosalpinx.  A small amount of Gelfoam was placed in the mesosalpinx.  Open tube itself did not appear disrupted.  Was allowed to leave the abdomen and the area was still found to be hemostatic.    Following the procedure the umbilical sheath  And trocars were removed after intra-abdominal carbon dioxide was expressed. The incision was closed with subcutaneous and subcuticular sutures of 4-0 monocryl.  The small incisions were closed with liquidband.  The intrauterine manipulator was then removed.  mosels was placed at the tenaculum site to obtain hemostasis.    Instrument, sponge, and needle counts were correct prior to abdominal closure and at the conclusion of the case.   Findings: The anterior cul-de-sac and round ligaments WNL The uterus WNL The adnexa SEE ABOVE Cul-de-sac WNL  Estimated Blood Loss:  Minimal         Drains: NONE         Total IV Fluids: 1341mL         Specimens: RIGHT fallopian tube cyst wall              Complications:  None; patient tolerated the procedure well.         Disposition: PACU -  hemodynamically stable.         Condition: stable

## 2017-02-28 NOTE — Anesthesia Procedure Notes (Signed)
Procedure Name: Intubation Date/Time: 02/28/2017 9:14 AM Performed by: Jonna Munro, CRNA Pre-anesthesia Checklist: Patient identified, Emergency Drugs available, Suction available, Patient being monitored and Timeout performed Patient Re-evaluated:Patient Re-evaluated prior to induction Oxygen Delivery Method: Circle system utilized Preoxygenation: Pre-oxygenation with 100% oxygen Induction Type: IV induction Ventilation: Mask ventilation without difficulty Laryngoscope Size: Mac and 3 Grade View: Grade I Tube type: Oral Tube size: 7.0 mm Number of attempts: 1 Airway Equipment and Method: Stylet Placement Confirmation: ETT inserted through vocal cords under direct vision,  positive ETCO2 and breath sounds checked- equal and bilateral Secured at: 22 cm Tube secured with: Tape Dental Injury: Teeth and Oropharynx as per pre-operative assessment

## 2017-02-28 NOTE — Transfer of Care (Signed)
Immediate Anesthesia Transfer of Care Note  Patient: Morgan Moran  Procedure(s) Performed: LAPAROSCOPIC OVARIAN CYSTECTOMY (Right Abdomen) LAPAROTOMY (N/A Abdomen)  Patient Location: PACU  Anesthesia Type:General  Level of Consciousness: awake, alert  and oriented  Airway & Oxygen Therapy: Patient Spontanous Breathing and Patient connected to nasal cannula oxygen  Post-op Assessment: Report given to RN and Post -op Vital signs reviewed and stable  Post vital signs: Reviewed and stable  Last Vitals:  Vitals:   02/28/17 0732  BP: 118/85  Pulse: 69  Resp: 16  Temp: 36.7 C  SpO2: 99%    Last Pain:  Vitals:   02/28/17 0732  TempSrc: Oral      Patients Stated Pain Goal: 3 (62/[id number]/38 [id number])  Complications: No apparent anesthesia complications

## 2017-02-28 NOTE — Anesthesia Preprocedure Evaluation (Signed)
Anesthesia Evaluation  Patient identified by MRN, date of birth, ID band Patient awake    Reviewed: Allergy & Precautions, NPO status , Patient's Chart, lab work & pertinent test results  Airway Mallampati: II  TM Distance: >3 FB Neck ROM: Full    Dental  (+) Teeth Intact, Dental Advisory Given   Pulmonary neg pulmonary ROS,    Pulmonary exam normal breath sounds clear to auscultation       Cardiovascular negative cardio ROS Normal cardiovascular exam Rhythm:Regular Rate:Normal     Neuro/Psych negative neurological ROS  negative psych ROS   GI/Hepatic negative GI ROS, Neg liver ROS,   Endo/Other  negative endocrine ROS  Renal/GU negative Renal ROS     Musculoskeletal negative musculoskeletal ROS (+)   Abdominal   Peds  Hematology negative hematology ROS (+)   Anesthesia Other Findings Day of surgery medications reviewed with the patient.  Reproductive/Obstetrics                             Anesthesia Physical Anesthesia Plan  ASA: II  Anesthesia Plan: General   Post-op Pain Management:    Induction: Intravenous  PONV Risk Score and Plan: 4 or greater and Ondansetron, Dexamethasone, Midazolam and Scopolamine patch - Pre-op  Airway Management Planned: Oral ETT  Additional Equipment:   Intra-op Plan:   Post-operative Plan: Extubation in OR  Informed Consent: I have reviewed the patients History and Physical, chart, labs and discussed the procedure including the risks, benefits and alternatives for the proposed anesthesia with the patient or authorized representative who has indicated his/her understanding and acceptance.   Dental advisory given  Plan Discussed with: CRNA  Anesthesia Plan Comments: (Risks/benefits of general anesthesia discussed with patient including risk of damage to teeth, lips, gum, and tongue, nausea/vomiting, allergic reactions to medications, and the  possibility of heart attack, stroke and death.  All patient questions answered.  Patient wishes to proceed.)        Anesthesia Quick Evaluation

## 2017-02-28 NOTE — Discharge Instructions (Signed)
Diagnostic Laparoscopy A diagnostic laparoscopy is a procedure to diagnose diseases in the abdomen. During the procedure, a thin, lighted, pencil-sized instrument called a laparoscope is inserted into the abdomen through an incision. The laparoscope allows your health care provider to look at the organs inside your body. Tell a health care provider about:  Any allergies you have.  All medicines you are taking, including vitamins, herbs, eye drops, creams, and over-the-counter medicines.  Any problems you or family members have had with anesthetic medicines.  Any blood disorders you have.  Any surgeries you have had.  Any medical conditions you have. What are the risks? Generally, this is a safe procedure. However, problems can occur, which may include:  Infection.  Bleeding.  Damage to other organs.  Allergic reaction to the anesthetics used during the procedure.  What happens before the procedure?  Do not eat or drink anything after midnight on the night before the procedure or as directed by your health care provider.  Ask your health care provider about: ? Changing or stopping your regular medicines. ? Taking medicines such as aspirin and ibuprofen. These medicines can thin your blood. Do not take these medicines before your procedure if your health care provider instructs you not to.  Plan to have someone take you home after the procedure. What happens during the procedure?  You may be given a medicine to help you relax (sedative).  You will be given a medicine to make you sleep (general anesthetic).  Your abdomen will be inflated with a gas. This will make your organs easier to see.  Small incisions will be made in your abdomen.  A laparoscope and other small instruments will be inserted into the abdomen through the incisions.  A tissue sample may be removed from an organ in the abdomen for examination.  The instruments will be removed from the abdomen.  The  gas will be released.  The incisions will be closed with stitches (sutures). What happens after the procedure? Your blood pressure, heart rate, breathing rate, and blood oxygen level will be monitored often until the medicines you were given have worn off. This information is not intended to replace advice given to you by your health care provider. Make sure you discuss any questions you have with your health care provider. Document Released: 07/17/2000 Document Revised: 08/19/2015 Document Reviewed: 11/21/2013 Elsevier Interactive Patient Education  2018 [facility] @ [phone] if:  You have a temperature greater than or equal to 100.4 degrees Farenheit orally You have pain that is not made better by the pain medication given and taken as directed You have excessive bleeding or problems urinating  Take Colace (Docusate Sodium/Stool Softener) 100 mg 2-3 times daily while taking narcotic pain medicine to avoid constipation or until bowel movements are regular. Take Ibuprofen 600 mg with food every 6 hours for 5 days  (first dose,  4:30 pm on day of surgery.  You may drive after 24 hours You may walk up steps  You may shower tomorrow You may resume a regular diet  Keep incisions clean and dry  Avoid anything in vagina  until after your post-operative visit

## 2017-03-01 ENCOUNTER — Encounter (HOSPITAL_COMMUNITY): Payer: Self-pay | Admitting: Obstetrics and Gynecology

## 2017-04-27 ENCOUNTER — Encounter (HOSPITAL_COMMUNITY): Payer: Self-pay | Admitting: Emergency Medicine

## 2017-04-27 ENCOUNTER — Other Ambulatory Visit: Payer: Self-pay

## 2017-04-27 ENCOUNTER — Ambulatory Visit (HOSPITAL_COMMUNITY)
Admission: EM | Admit: 2017-04-27 | Discharge: 2017-04-27 | Disposition: A | Discharge status: Home / Self Care | Payer: BLUE CROSS/BLUE SHIELD | Attending: Physician Assistant | Admitting: Physician Assistant

## 2017-04-27 DIAGNOSIS — N898 Other specified noninflammatory disorders of vagina: Secondary | ICD-10-CM

## 2017-04-27 DIAGNOSIS — B373 Candidiasis of vulva and vagina: Secondary | ICD-10-CM | POA: Diagnosis not present

## 2017-04-27 DIAGNOSIS — B9689 Other specified bacterial agents as the cause of diseases classified elsewhere: Secondary | ICD-10-CM | POA: Diagnosis not present

## 2017-04-27 MED ORDER — FLUCONAZOLE 150 MG PO TABS: 150.0000 mg | ORAL_TABLET | Freq: Every day | ORAL | 0 refills | Status: AC

## 2017-04-27 NOTE — ED Provider Notes (Signed)
Morgan Moran    CSN: [id number] Arrival date & time: 04/27/17  1717     History   Chief Complaint Chief Complaint  Patient presents with  . Vaginal Discharge    HPI Morgan Moran is a 22 y.o. female.   22 year old female comes in for 3-day history of vaginal itching and discharge.  Patient states that she started the Depot injection 3 months ago, she has since then had some regular discharge with spotting.  However, about 3 days ago, started having vaginal itching and "clumpy discharge".  Denies abdominal pain, nausea, vomiting.  Denies fever, chills, night sweats.  Denies urinary symptoms such as dysuria, hematuria, frequency.  She is sexually active with 1 female partner, condom use.  Denies new products, though she has been using tampons due to the spotting.      Past Medical History:  Diagnosis Date  . Medical history non-contributory   . Ovarian cyst   . Ovarian cyst     There are no active problems to display for this patient.   Past Surgical History:  Procedure Laterality Date  . LAPAROSCOPY Right 02/28/2017   Procedure: DIAGNOSTIC LAPAROSCOPY WITH RIGHT FALLOPIAN TUBE CYSTECTOMY;  Surgeon: Crawford Givens, MD;  Location: [facility];  Service: Gynecology;  Laterality: Right;  . WISDOM TOOTH EXTRACTION      OB History    No data available       Home Medications    Prior to Admission medications   Medication Sig Start Date End Date Taking? Authorizing Provider  fluconazole (DIFLUCAN) 150 MG tablet Take 1 tablet (150 mg total) by mouth daily. Take second dose 72 hours later if symptoms still persists. 04/27/17   Ok Edwards, PA-C  ibuprofen (ADVIL,MOTRIN) 600 MG tablet 1 po pc every 6 hours for 5 days then prn-pain 02/28/17   Earnstine Regal, PA-C  medroxyPROGESTERone (DEPO-PROVERA) 150 MG/ML injection Inject 150 mg IM every 3 months, Injection given in 12/2016.    [provider]    Family History No family history on file.  Social History Social  History   Tobacco Use  . Smoking status: Never Smoker  . Smokeless tobacco: Never Used  Substance Use Topics  . Alcohol use: Yes    Comment: socially  . Drug use: No     Allergies   Patient has no known allergies.   Review of Systems Review of Systems  Reason unable to perform ROS: See HPI as above.     Physical Exam Triage Vital Signs ED Triage Vitals  Enc Vitals Group     BP 04/27/17 1821 122/73     Pulse Rate 04/27/17 1821 90     Resp 04/27/17 1821 14     Temp 04/27/17 1821 98.1 F (36.7 C)     Temp Source 04/27/17 1821 Oral     SpO2 04/27/17 1821 100 %     Weight --      Height --      Head Circumference --      Peak Flow --      Pain Score 04/27/17 1822 3     Pain Loc --      Pain Edu? --      Excl. in [place]? --    No data found.  Updated Vital Signs BP 122/73   Pulse 90   Temp 98.1 F (36.7 C) (Oral)   Resp 14   SpO2 100%   Visual Acuity Right Eye Distance:   Left Eye Distance:  Bilateral Distance:    Right Eye Near:   Left Eye Near:    Bilateral Near:     Physical Exam  Constitutional: She is oriented to person, place, and time. She appears well-developed and well-nourished. No distress.  HENT:  Head: Normocephalic and atraumatic.  Eyes: Conjunctivae are normal. Pupils are equal, round, and reactive to light.  Cardiovascular: Normal rate, regular rhythm and normal heart sounds. Exam reveals no gallop and no friction rub.  No murmur heard. Pulmonary/Chest: Effort normal and breath sounds normal. She has no wheezes. She has no rales.  Abdominal: Soft. Bowel sounds are normal. She exhibits no mass. There is no tenderness. There is no rebound, no guarding and no CVA tenderness.  Genitourinary:  Genitourinary Comments: Deferred.  Patient self swab for cytology.  Neurological: She is alert and oriented to person, place, and time.  Skin: Skin is warm and dry.  Psychiatric: She has a normal mood and affect. Her behavior is normal. Judgment  normal.     UC Treatments / Results  Labs (all labs ordered are listed, but only abnormal results are displayed) Labs Reviewed  CERVICOVAGINAL ANCILLARY ONLY    EKG  EKG Interpretation None       Radiology No results found.  Procedures Procedures (including critical care time)  Medications Ordered in UC Medications - No data to display   Initial Impression / Assessment and Plan / UC Course  I have reviewed the triage vital signs and the nursing notes.  Pertinent labs & imaging results that were available during my care of the patient were reviewed by me and considered in my medical decision making (see chart for details).    Patient was treated empirically for yeast.  Start Diflucan as directed.  Cytology sent, patient will be contacted with any positive results that require additional treatment. Patient to refrain from sexual activity for the next 7 days. Return precautions given.    Final Clinical Impressions(s) / UC Diagnoses   Final diagnoses:  Vaginal discharge    ED Discharge Orders        Ordered    fluconazole (DIFLUCAN) 150 MG tablet  Daily     04/27/17 2005        Yu, Amy V, [place] 04/27/17 2011

## 2017-04-27 NOTE — ED Triage Notes (Signed)
Pt states "I think I have a yeast infection, discharge itching, it started stinging last night".

## 2017-04-27 NOTE — Discharge Instructions (Addendum)
You were treated empirically for yeast.  Start Diflucan as directed. Cytology sent, you will be contacted with any positive results that requires further treatment. Refrain from sexual activity for the next 7 days. Monitor for any worsening of symptoms, fever, abdominal pain, nausea, vomiting, to follow up for reevaluation.

## 2017-04-30 LAB — CERVICOVAGINAL ANCILLARY ONLY
BACTERIAL VAGINITIS: NEGATIVE
Candida vaginitis: POSITIVE — AB
Chlamydia: NEGATIVE
Neisseria Gonorrhea: NEGATIVE
Trichomonas: NEGATIVE

## 2017-10-28 IMAGING — CT CT ABD-PELV W/ CM
2 of 4 series · 16 of 46 positions shown, 18 images · IV contrast (Omni 300)
Comparison: None.

CLINICAL DATA: Right lower quadrant pain since last night. Nausea
and vomiting. Concern for appendicitis.

EXAM:
CT ABDOMEN AND PELVIS WITH CONTRAST
TECHNIQUE: Multidetector CT imaging of the abdomen and pelvis was performed
using the standard protocol following bolus administration of
intravenous contrast.
CONTRAST:  100mL SWZ8XT-EBB IOPAMIDOL (SWZ8XT-EBB) INJECTION 61%

[Series 2: a/p w/ 5mm · axial · 0.69mm/px · z∈[+513,+983]mm · 13 of 104 slices shown, 15 images]
[im 5/104  soft-tissue]
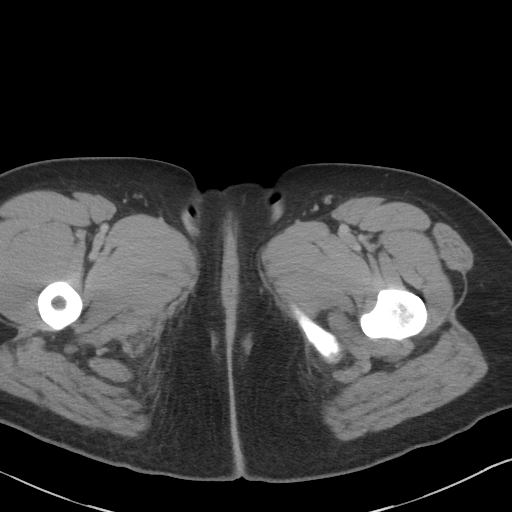
[im 5/104  bone]
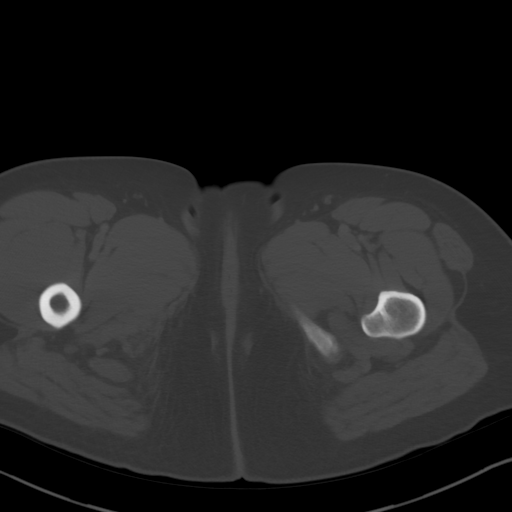
[im 13/104  soft-tissue]
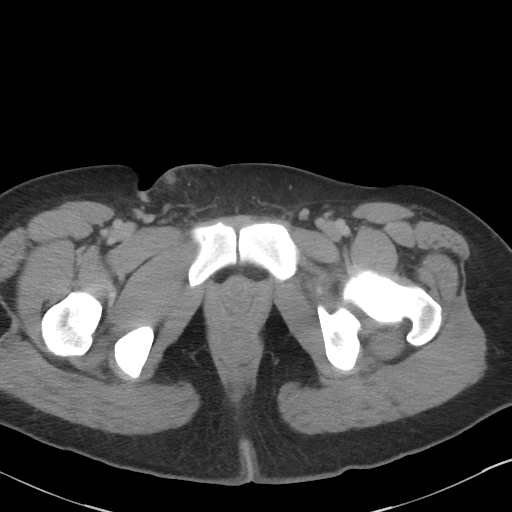
[im 21/104  soft-tissue]
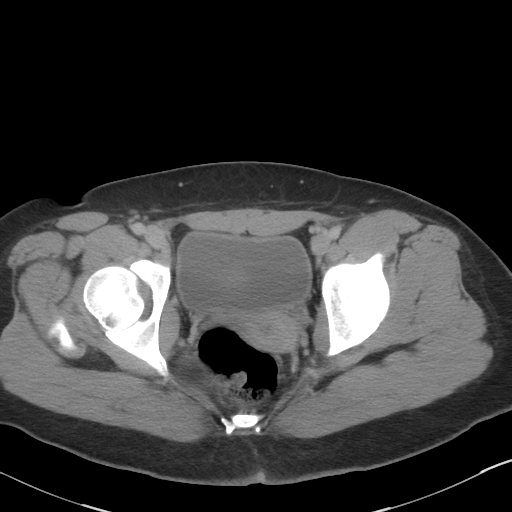
[im 29/104  soft-tissue]
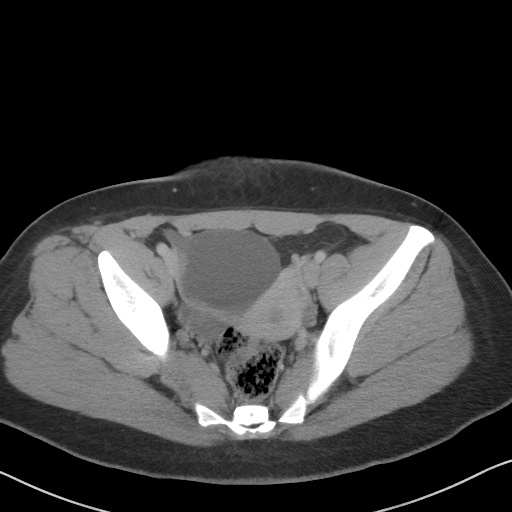
[im 38/104  soft-tissue]
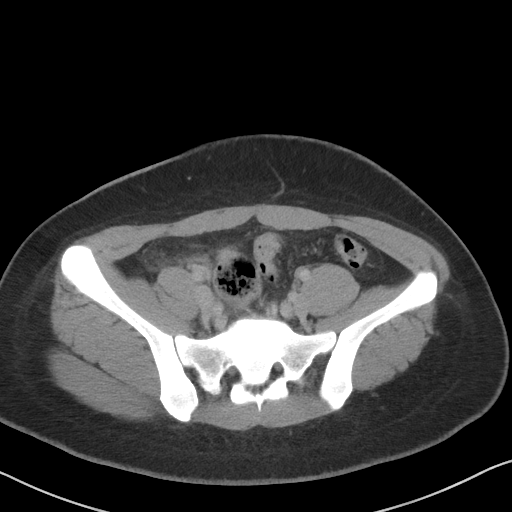
[im 46/104  soft-tissue]
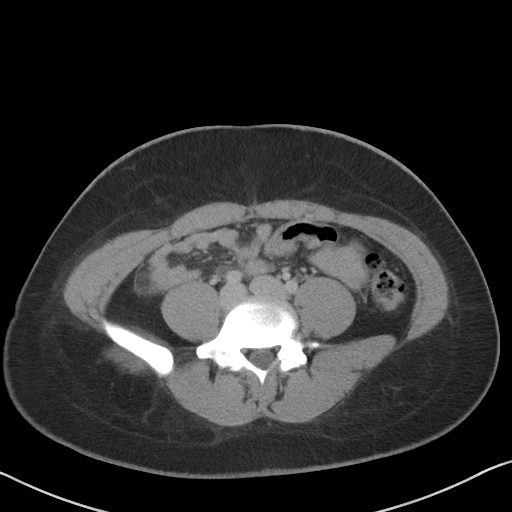
[im 54/104  soft-tissue]
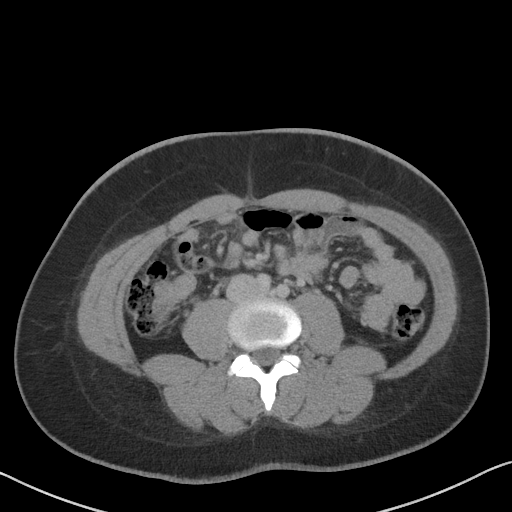
[im 58/104  soft-tissue]
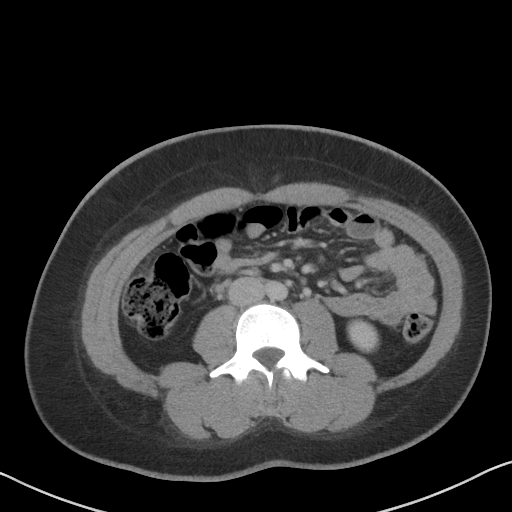
[im 66/104  soft-tissue]
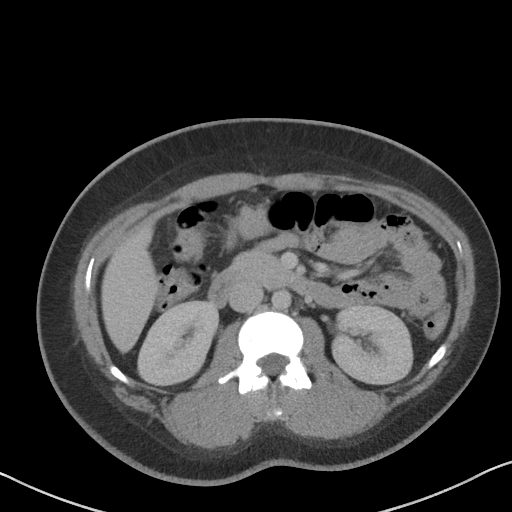
[im 66/104  bone]
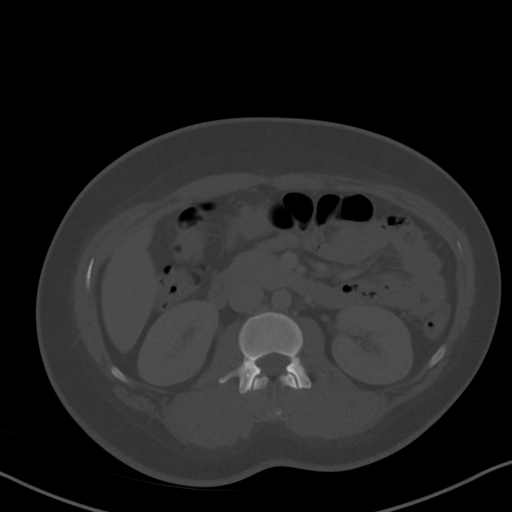
[im 75/104  soft-tissue]
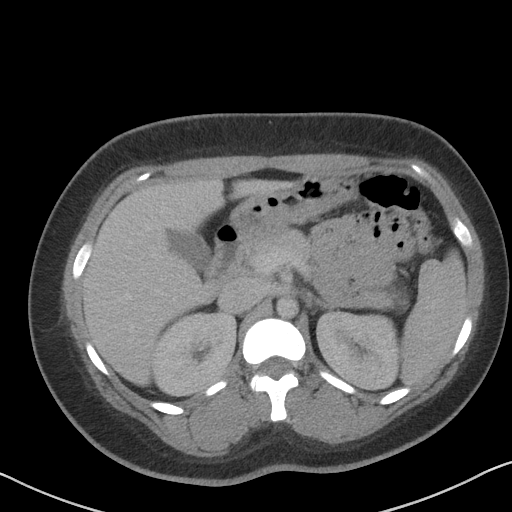
[im 83/104  soft-tissue]
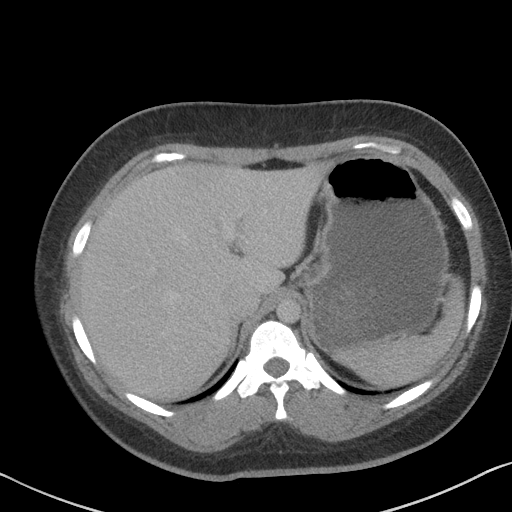
[im 91/104  soft-tissue]
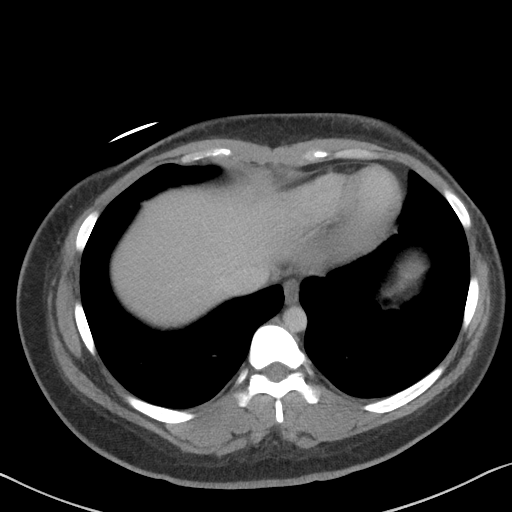
[im 99/104  soft-tissue]
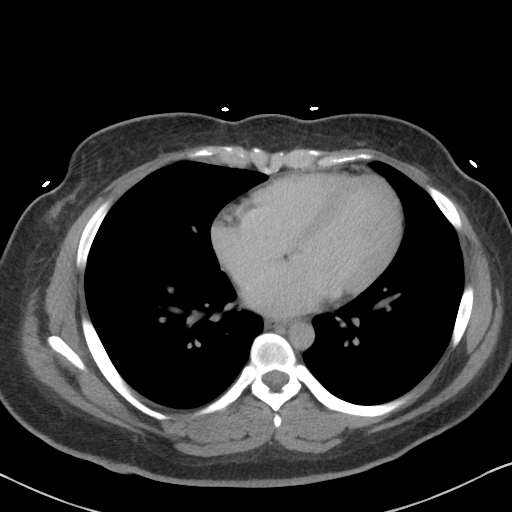

[Series 5: a/p w/ cor · coronal · 0.78mm/px · 3 of 124 slices shown]
[im 42/124  soft-tissue]
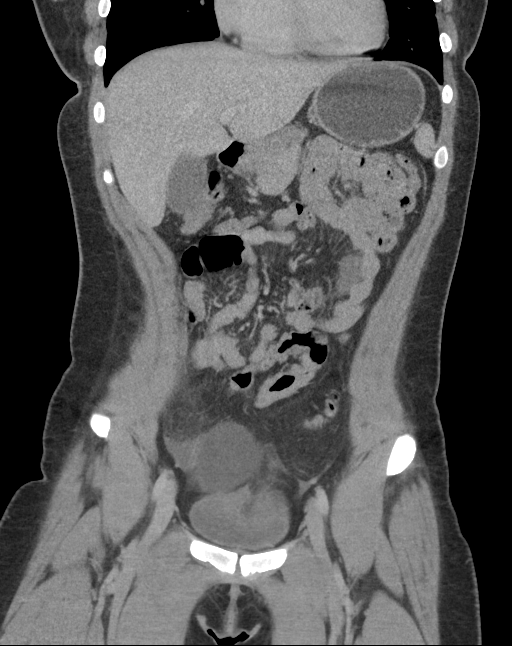
[im 55/124  soft-tissue]
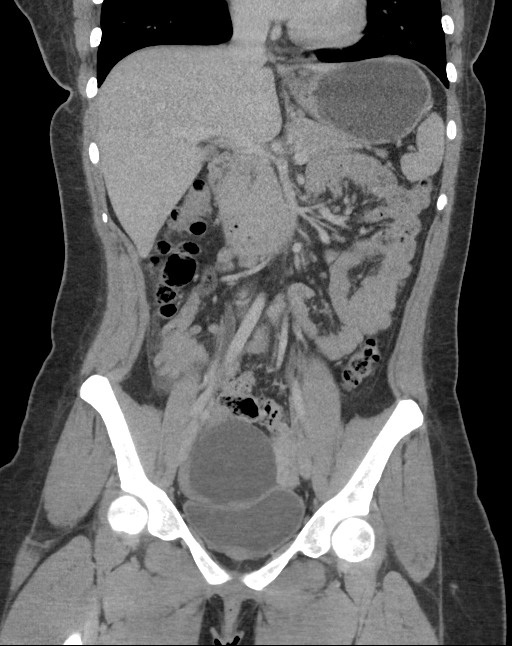
[im 69/124  soft-tissue]
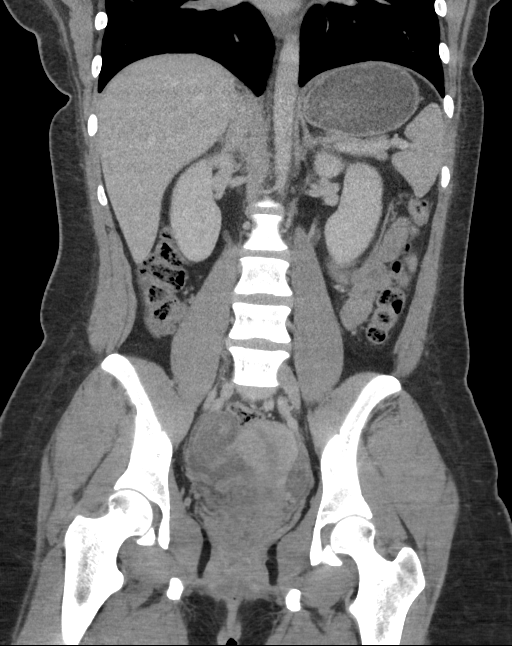

[16 of 46 positions shown; findings below may reference images not displayed]

FINDINGS: Lower chest: 3 x 2 mm calcified nodule in the right lower lobe.

Hepatobiliary: No focal liver abnormality is seen. No gallstones,
gallbladder wall thickening, or biliary dilatation.

Pancreas: Unremarkable.

Spleen: Unremarkable.

Adrenals/Urinary Tract: Adrenal glands are unremarkable. Kidneys are
normal, without renal calculi, focal lesion, or hydronephrosis.
Bladder is unremarkable.

Stomach/Bowel: Stomach is within normal limits. Appendix appears
normal. No evidence of bowel wall thickening or obstruction.

Vascular/Lymphatic: No significant vascular findings are present. No
enlarged abdominal or pelvic lymph nodes.

Reproductive: Focal low-density at the level of the cervix may
reflect fluid/ blood products in the endocervical canal. Left ovary
is unremarkable. There is a 7.7 x 6.5 cm low-density cyst in the
right adnexa. Small volume soft tissue along its margins may reflect
normal ovarian tissue or a small amount of hemorrhage. There is mild
surrounding stranding in the right adnexa/right lower quadrant. A
right ovarian corpus luteum is suspected more posteriorly.

Other: No abdominal wall mass or hernia.

Musculoskeletal: No acute or significant osseous findings.
IMPRESSION: 1. 7.7 cm right ovarian cyst with mild surrounding inflammatory
change. If there is clinical concern for ovarian torsion, pelvic
ultrasound (with Doppler imaging) is recommended for further
evaluation. A follow-up ultrasound will also be needed in 6-12 weeks
to ensure resolution.
2. Normal appendix.

## 2020-08-31 ENCOUNTER — Other Ambulatory Visit: Payer: BLUE CROSS/BLUE SHIELD

## 2022-05-10 DIAGNOSIS — F4310 Post-traumatic stress disorder, unspecified: Secondary | ICD-10-CM | POA: Diagnosis not present

## 2022-05-23 DIAGNOSIS — F4310 Post-traumatic stress disorder, unspecified: Secondary | ICD-10-CM | POA: Diagnosis not present

## 2022-06-07 DIAGNOSIS — F4310 Post-traumatic stress disorder, unspecified: Secondary | ICD-10-CM | POA: Diagnosis not present

## 2022-06-20 DIAGNOSIS — F4310 Post-traumatic stress disorder, unspecified: Secondary | ICD-10-CM | POA: Diagnosis not present

## 2022-07-04 DIAGNOSIS — F4310 Post-traumatic stress disorder, unspecified: Secondary | ICD-10-CM | POA: Diagnosis not present

## 2022-07-21 DIAGNOSIS — F4310 Post-traumatic stress disorder, unspecified: Secondary | ICD-10-CM | POA: Diagnosis not present

## 2022-08-01 DIAGNOSIS — F4310 Post-traumatic stress disorder, unspecified: Secondary | ICD-10-CM | POA: Diagnosis not present

## 2022-08-14 DIAGNOSIS — F4310 Post-traumatic stress disorder, unspecified: Secondary | ICD-10-CM | POA: Diagnosis not present

## 2022-10-06 DIAGNOSIS — E669 Obesity, unspecified: Secondary | ICD-10-CM | POA: Diagnosis not present

## 2022-10-06 DIAGNOSIS — Z2989 Encounter for other specified prophylactic measures: Secondary | ICD-10-CM | POA: Diagnosis not present

## 2022-10-06 DIAGNOSIS — H6690 Otitis media, unspecified, unspecified ear: Secondary | ICD-10-CM | POA: Diagnosis not present

## 2022-10-06 DIAGNOSIS — Z0000 Encounter for general adult medical examination without abnormal findings: Secondary | ICD-10-CM | POA: Diagnosis not present

## 2022-10-06 DIAGNOSIS — Z23 Encounter for immunization: Secondary | ICD-10-CM | POA: Diagnosis not present

## 2022-10-06 DIAGNOSIS — R110 Nausea: Secondary | ICD-10-CM | POA: Diagnosis not present

## 2022-10-06 DIAGNOSIS — A09 Infectious gastroenteritis and colitis, unspecified: Secondary | ICD-10-CM | POA: Diagnosis not present

## 2022-10-11 DIAGNOSIS — Z13220 Encounter for screening for lipoid disorders: Secondary | ICD-10-CM | POA: Diagnosis not present

## 2022-10-11 DIAGNOSIS — Z0000 Encounter for general adult medical examination without abnormal findings: Secondary | ICD-10-CM | POA: Diagnosis not present

## 2022-10-11 DIAGNOSIS — E669 Obesity, unspecified: Secondary | ICD-10-CM | POA: Diagnosis not present

## 2023-01-09 DIAGNOSIS — Z1389 Encounter for screening for other disorder: Secondary | ICD-10-CM | POA: Diagnosis not present

## 2023-01-09 DIAGNOSIS — Z01419 Encounter for gynecological examination (general) (routine) without abnormal findings: Secondary | ICD-10-CM | POA: Diagnosis not present

## 2023-01-09 DIAGNOSIS — Z113 Encounter for screening for infections with a predominantly sexual mode of transmission: Secondary | ICD-10-CM | POA: Diagnosis not present

## 2023-01-09 DIAGNOSIS — E282 Polycystic ovarian syndrome: Secondary | ICD-10-CM | POA: Diagnosis not present

## 2023-03-29 DIAGNOSIS — E669 Obesity, unspecified: Secondary | ICD-10-CM | POA: Diagnosis not present

## 2023-03-29 DIAGNOSIS — E282 Polycystic ovarian syndrome: Secondary | ICD-10-CM | POA: Diagnosis not present

## 2023-04-02 ENCOUNTER — Other Ambulatory Visit (HOSPITAL_COMMUNITY): Payer: Self-pay

## 2023-04-02 ENCOUNTER — Other Ambulatory Visit: Payer: Self-pay

## 2023-04-02 MED ORDER — METFORMIN HCL 500 MG PO TABS
500.0000 mg | ORAL_TABLET | Freq: Every day | ORAL | 3 refills | Status: DC
  Filled 2023-04-02 (×2): qty 30, 30d supply, fill #0
  Filled 2023-04-27: qty 30, 30d supply, fill #1
  Filled 2023-07-03 (×2): qty 30, 30d supply, fill #2
  Filled 2023-08-06 (×2): qty 30, 30d supply, fill #3

## 2023-04-03 ENCOUNTER — Other Ambulatory Visit: Payer: Self-pay

## 2023-04-03 ENCOUNTER — Other Ambulatory Visit (HOSPITAL_COMMUNITY): Payer: Self-pay

## 2023-05-04 ENCOUNTER — Other Ambulatory Visit: Payer: Self-pay

## 2023-07-03 ENCOUNTER — Other Ambulatory Visit (HOSPITAL_COMMUNITY): Payer: Self-pay

## 2023-07-03 ENCOUNTER — Other Ambulatory Visit: Payer: Self-pay

## 2023-08-06 ENCOUNTER — Other Ambulatory Visit (HOSPITAL_COMMUNITY): Payer: Self-pay

## 2023-08-30 ENCOUNTER — Other Ambulatory Visit: Payer: Self-pay

## 2023-08-30 ENCOUNTER — Other Ambulatory Visit (HOSPITAL_COMMUNITY): Payer: Self-pay

## 2023-08-30 MED ORDER — METFORMIN HCL 500 MG PO TABS
500.0000 mg | ORAL_TABLET | Freq: Every day | ORAL | 3 refills | Status: DC
  Filled 2023-08-30: qty 30, 30d supply, fill #0
  Filled 2023-10-01: qty 30, 30d supply, fill #1
  Filled 2023-11-07: qty 30, 30d supply, fill #2
  Filled 2023-12-03: qty 30, 30d supply, fill #3

## 2023-09-19 DIAGNOSIS — F4310 Post-traumatic stress disorder, unspecified: Secondary | ICD-10-CM | POA: Diagnosis not present

## 2023-10-02 ENCOUNTER — Other Ambulatory Visit: Payer: Self-pay

## 2023-10-22 DIAGNOSIS — H5213 Myopia, bilateral: Secondary | ICD-10-CM | POA: Diagnosis not present

## 2023-11-01 DIAGNOSIS — L700 Acne vulgaris: Secondary | ICD-10-CM | POA: Diagnosis not present

## 2023-11-01 DIAGNOSIS — E669 Obesity, unspecified: Secondary | ICD-10-CM | POA: Diagnosis not present

## 2023-11-01 DIAGNOSIS — R030 Elevated blood-pressure reading, without diagnosis of hypertension: Secondary | ICD-10-CM | POA: Diagnosis not present

## 2023-11-01 DIAGNOSIS — Z0000 Encounter for general adult medical examination without abnormal findings: Secondary | ICD-10-CM | POA: Diagnosis not present

## 2023-11-01 DIAGNOSIS — E282 Polycystic ovarian syndrome: Secondary | ICD-10-CM | POA: Diagnosis not present

## 2023-11-07 ENCOUNTER — Other Ambulatory Visit (HOSPITAL_COMMUNITY): Payer: Self-pay

## 2023-11-08 ENCOUNTER — Other Ambulatory Visit: Payer: Self-pay

## 2023-11-08 ENCOUNTER — Other Ambulatory Visit (HOSPITAL_COMMUNITY): Payer: Self-pay

## 2023-12-03 ENCOUNTER — Other Ambulatory Visit (HOSPITAL_COMMUNITY): Payer: Self-pay

## 2023-12-31 ENCOUNTER — Other Ambulatory Visit (HOSPITAL_COMMUNITY): Payer: Self-pay

## 2023-12-31 ENCOUNTER — Other Ambulatory Visit: Payer: Self-pay

## 2023-12-31 MED ORDER — METFORMIN HCL 500 MG PO TABS
500.0000 mg | ORAL_TABLET | Freq: Every day | ORAL | 0 refills | Status: DC
  Filled 2023-12-31: qty 30, 30d supply, fill #0

## 2024-01-16 DIAGNOSIS — Z130 Encounter for screening for diseases of the blood and blood-forming organs and certain disorders involving the immune mechanism: Secondary | ICD-10-CM | POA: Diagnosis not present

## 2024-01-16 DIAGNOSIS — Z01419 Encounter for gynecological examination (general) (routine) without abnormal findings: Secondary | ICD-10-CM | POA: Diagnosis not present

## 2024-01-16 DIAGNOSIS — E282 Polycystic ovarian syndrome: Secondary | ICD-10-CM | POA: Diagnosis not present

## 2024-01-16 DIAGNOSIS — Z1389 Encounter for screening for other disorder: Secondary | ICD-10-CM | POA: Diagnosis not present

## 2024-01-30 ENCOUNTER — Other Ambulatory Visit (HOSPITAL_COMMUNITY): Payer: Self-pay

## 2024-01-31 ENCOUNTER — Other Ambulatory Visit (HOSPITAL_COMMUNITY): Payer: Self-pay

## 2024-01-31 MED ORDER — METFORMIN HCL 500 MG PO TABS
500.0000 mg | ORAL_TABLET | Freq: Every day | ORAL | 11 refills | Status: AC
  Filled 2024-01-31: qty 30, 30d supply, fill #0
  Filled 2024-03-03: qty 30, 30d supply, fill #1
  Filled 2024-03-30: qty 30, 30d supply, fill #2
  Filled 2024-04-29: qty 30, 30d supply, fill #3
  Filled 2024-05-28: qty 90, 90d supply, fill #4

## 2024-02-01 ENCOUNTER — Other Ambulatory Visit: Payer: Self-pay

## 2024-03-03 ENCOUNTER — Other Ambulatory Visit (HOSPITAL_COMMUNITY): Payer: Self-pay

## 2024-03-30 ENCOUNTER — Other Ambulatory Visit (HOSPITAL_COMMUNITY): Payer: Self-pay

## 2024-04-29 ENCOUNTER — Other Ambulatory Visit (HOSPITAL_COMMUNITY): Payer: Self-pay

## 2024-05-29 ENCOUNTER — Other Ambulatory Visit (HOSPITAL_BASED_OUTPATIENT_CLINIC_OR_DEPARTMENT_OTHER): Payer: Self-pay

## 2024-05-29 ENCOUNTER — Other Ambulatory Visit: Payer: Self-pay
